# Patient Record
Sex: Female | Born: 1980 | Race: White | Hispanic: No | Marital: Married | Smoking: Never smoker
Health system: Southern US, Community
[De-identification: ages and names within clinical notes are randomized; demographics above are authoritative.]

## PROBLEM LIST (undated history)

## (undated) DIAGNOSIS — F419 Anxiety disorder, unspecified: Secondary | ICD-10-CM

## (undated) DIAGNOSIS — R102 Pelvic and perineal pain: Secondary | ICD-10-CM

## (undated) DIAGNOSIS — K37 Unspecified appendicitis: Secondary | ICD-10-CM

## (undated) DIAGNOSIS — D649 Anemia, unspecified: Secondary | ICD-10-CM

## (undated) DIAGNOSIS — Z803 Family history of malignant neoplasm of breast: Secondary | ICD-10-CM

## (undated) HISTORY — PX: TUBAL LIGATION: SHX77

## (undated) HISTORY — DX: Anemia, unspecified: D64.9

## (undated) HISTORY — DX: Family history of malignant neoplasm of breast: Z80.3

## (undated) HISTORY — DX: Anxiety disorder, unspecified: F41.9

## (undated) HISTORY — DX: Unspecified appendicitis: K37

## (undated) HISTORY — DX: Pelvic and perineal pain: R10.2

## (undated) HISTORY — PX: DILATION AND EVACUATION: SHX1459

## (undated) HISTORY — PX: LAPAROSCOPY: SHX197

## (undated) HISTORY — PX: APPENDECTOMY: SHX54

---

## 2001-08-03 ENCOUNTER — Encounter: Payer: Self-pay | Admitting: Emergency Medicine

## 2001-08-03 ENCOUNTER — Emergency Department (HOSPITAL_COMMUNITY): Admission: EM | Admit: 2001-08-03 | Discharge: 2001-08-03 | Payer: Self-pay | Admitting: Emergency Medicine

## 2004-11-25 HISTORY — PX: LAPAROSCOPY: SHX197

## 2009-12-25 ENCOUNTER — Ambulatory Visit: Payer: Self-pay | Admitting: Obstetrics & Gynecology

## 2009-12-26 ENCOUNTER — Inpatient Hospital Stay: Payer: Self-pay | Admitting: Obstetrics & Gynecology

## 2010-08-07 DIAGNOSIS — D239 Other benign neoplasm of skin, unspecified: Secondary | ICD-10-CM

## 2010-08-07 HISTORY — DX: Other benign neoplasm of skin, unspecified: D23.9

## 2012-04-20 ENCOUNTER — Ambulatory Visit: Payer: Self-pay | Admitting: Medical

## 2017-06-17 ENCOUNTER — Ambulatory Visit (INDEPENDENT_AMBULATORY_CARE_PROVIDER_SITE_OTHER): Payer: BC Managed Care – PPO | Admitting: Obstetrics and Gynecology

## 2017-06-17 ENCOUNTER — Encounter: Payer: Self-pay | Admitting: Obstetrics and Gynecology

## 2017-06-17 VITALS — BP 124/78 | Ht 66.0 in | Wt 228.0 lb

## 2017-06-17 DIAGNOSIS — Z124 Encounter for screening for malignant neoplasm of cervix: Secondary | ICD-10-CM | POA: Diagnosis not present

## 2017-06-17 DIAGNOSIS — Z1151 Encounter for screening for human papillomavirus (HPV): Secondary | ICD-10-CM | POA: Diagnosis not present

## 2017-06-17 DIAGNOSIS — Z803 Family history of malignant neoplasm of breast: Secondary | ICD-10-CM

## 2017-06-17 DIAGNOSIS — Z01419 Encounter for gynecological examination (general) (routine) without abnormal findings: Secondary | ICD-10-CM | POA: Diagnosis not present

## 2017-06-17 NOTE — Progress Notes (Signed)
Chief Complaint  Patient presents with  . Annual Exam     HPI:      [name] is a 36 y.o. [phone] who LMP was Patient's last menstrual period was 05/27/2017., presents today for her annual examination.  Her menses are Q6-8 wks (for several yrs, neg labs with Dr. Leonides Schanz last yr), lasting 5 days.  Dysmenorrhea none. She does not have intermenstrual bleeding.  Sex activity: single partner, contraception - tubal ligation.  Last Pap: October 19, 2014  Results were: no abnormalities  Hx of STDs: none  There is a FH of breast cancer in her pat aunt and mat aunt, genetic testing not done. There is no FH of ovarian cancer. The patient does do self-breast exams.  Tobacco use: The patient denies current or previous tobacco use. Alcohol use: none Exercise: moderately active  She does not get adequate calcium and Vitamin D in her diet.    Past Medical History:  Diagnosis Date  . Anemia   . Appendicitis   . Pelvic pain     Past Surgical History:  Procedure Laterality Date  . APPENDECTOMY    . CESAREAN SECTION    . LAPAROSCOPY    . TUBAL LIGATION      Family History  Problem Relation Age of Onset  . Hypertension Mother   . Hyperlipidemia Mother   . Hypertension Father   . Hyperlipidemia Father   . Breast cancer Maternal Aunt 60  . Brain cancer Maternal Aunt   . Breast cancer Paternal Aunt        age 31/50  . Colon cancer Paternal Aunt 48  . Diabetes Mellitus II Paternal Grandfather     Social History   Social History  . Marital status: Single    Spouse name: N/A  . Number of children: N/A  . Years of education: N/A   Occupational History  . Not on file.   Social History Main Topics  . Smoking status: Never Smoker  . Smokeless tobacco: Never Used  . Alcohol use No  . Drug use: No  . Sexual activity: Yes    Birth control/ protection: Surgical   Other Topics Concern  . Not on file   Social History Narrative  . No narrative on file    No  current outpatient prescriptions on file.  ROS:  Review of Systems  Constitutional: Negative for fatigue, fever and unexpected weight change.  Respiratory: Positive for cough. Negative for shortness of breath and wheezing.   Cardiovascular: Negative for chest pain, palpitations and leg swelling.  Gastrointestinal: Negative for blood in stool, constipation, diarrhea, nausea and vomiting.  Endocrine: Negative for cold intolerance, heat intolerance and polyuria.  Genitourinary: Negative for dyspareunia, dysuria, flank pain, frequency, genital sores, hematuria, menstrual problem, pelvic pain, urgency, vaginal bleeding, vaginal discharge and vaginal pain.  Musculoskeletal: Negative for back pain, joint swelling and myalgias.  Skin: Negative for rash.  Neurological: Negative for dizziness, syncope, light-headedness, numbness and headaches.  Hematological: Negative for adenopathy.  Psychiatric/Behavioral: Negative for agitation, confusion, sleep disturbance and suicidal ideas. The patient is not nervous/anxious.      Objective: BP 124/78   Ht 5\' 6"  (1.676 m)   Wt 228 lb (103.4 kg)   LMP 05/27/2017   BMI 36.80 kg/m    Physical Exam  Constitutional: She is oriented to person, place, and time. She appears well-developed and well-nourished.  Genitourinary: Vagina normal and uterus normal. There is no rash or tenderness on the right labia.  There is no rash or tenderness on the left labia. No erythema or tenderness in the vagina. No vaginal discharge found. Right adnexum does not display mass and does not display tenderness. Left adnexum does not display mass and does not display tenderness. Cervix does not exhibit motion tenderness or polyp. Uterus is not enlarged or tender.  Neck: Normal range of motion. No thyromegaly present.  Cardiovascular: Normal rate, regular rhythm and normal heart sounds.   No murmur heard. Pulmonary/Chest: Effort normal and breath sounds normal. Right breast exhibits  no mass, no nipple discharge, no skin change and no tenderness. Left breast exhibits no mass, no nipple discharge, no skin change and no tenderness.  Abdominal: Soft. There is no tenderness. There is no guarding.  Musculoskeletal: Normal range of motion.  Neurological: She is alert and oriented to person, place, and time. No cranial nerve deficit.  Psychiatric: She has a normal mood and affect. Her behavior is normal.  Vitals reviewed.   Assessment/Plan: Encounter for annual routine gynecological examination  Cervical cancer screening - Plan: IGP, Aptima HPV  Screening for HPV (human papillomavirus) - Plan: IGP, Aptima HPV  Family history of breast cancer - Pt to clarify age of dx of pat aunt. If 7, pt qualifies for [facility] testing and possibly mammos starting at age 11. Pt to f/u with info.             GYN counsel adequate intake of calcium and vitamin D     F/U  Return in about 1 year (around 06/17/2018).  Tamura Lasky B. Maurio Baize, PA-C 06/17/2017 9:48 AM

## 2017-06-23 LAB — IGP, APTIMA HPV
HPV Aptima: NEGATIVE
PAP Smear Comment: 0

## 2017-07-09 ENCOUNTER — Encounter: Payer: Self-pay | Admitting: Obstetrics and Gynecology

## 2017-07-09 ENCOUNTER — Telehealth: Payer: Self-pay | Admitting: Obstetrics and Gynecology

## 2017-07-09 NOTE — Telephone Encounter (Signed)
Pt aware of unsatisfactory pap/neg HPV DNA. She will RTO in 3-4 months for repeat pap.  Pt also found out her mat aunt had breast cancer age 35 and was BRCA neg. Pt doesn't want MyRisk testing but is concerned about having to do to mammos earlier. Will do TC model when she RTO for that info. Pt agrees with plan.

## 2018-06-24 ENCOUNTER — Ambulatory Visit: Payer: BC Managed Care – PPO | Admitting: Obstetrics and Gynecology

## 2018-06-26 DIAGNOSIS — I83813 Varicose veins of bilateral lower extremities with pain: Secondary | ICD-10-CM | POA: Insufficient documentation

## 2018-06-26 DIAGNOSIS — G44229 Chronic tension-type headache, not intractable: Secondary | ICD-10-CM | POA: Insufficient documentation

## 2018-06-26 DIAGNOSIS — M545 Low back pain, unspecified: Secondary | ICD-10-CM | POA: Insufficient documentation

## 2018-06-27 DIAGNOSIS — K769 Liver disease, unspecified: Secondary | ICD-10-CM | POA: Insufficient documentation

## 2018-07-17 DIAGNOSIS — K8020 Calculus of gallbladder without cholecystitis without obstruction: Secondary | ICD-10-CM | POA: Insufficient documentation

## 2018-07-28 ENCOUNTER — Ambulatory Visit: Payer: BC Managed Care – PPO | Admitting: Obstetrics and Gynecology

## 2018-09-02 ENCOUNTER — Ambulatory Visit: Payer: BC Managed Care – PPO | Admitting: Obstetrics and Gynecology

## 2018-09-22 ENCOUNTER — Other Ambulatory Visit (HOSPITAL_COMMUNITY)
Admission: RE | Admit: 2018-09-22 | Discharge: 2018-09-22 | Disposition: A | Discharge status: Home / Self Care | Payer: BC Managed Care – PPO | Source: Ambulatory Visit | Attending: Obstetrics and Gynecology | Admitting: Obstetrics and Gynecology

## 2018-09-22 ENCOUNTER — Encounter: Payer: Self-pay | Admitting: Obstetrics and Gynecology

## 2018-09-22 ENCOUNTER — Ambulatory Visit (INDEPENDENT_AMBULATORY_CARE_PROVIDER_SITE_OTHER): Payer: BC Managed Care – PPO | Admitting: Obstetrics and Gynecology

## 2018-09-22 VITALS — BP 120/80 | HR 69 | Ht 66.0 in | Wt 232.0 lb

## 2018-09-22 DIAGNOSIS — Z124 Encounter for screening for malignant neoplasm of cervix: Secondary | ICD-10-CM

## 2018-09-22 DIAGNOSIS — Z01419 Encounter for gynecological examination (general) (routine) without abnormal findings: Secondary | ICD-10-CM | POA: Diagnosis not present

## 2018-09-22 DIAGNOSIS — Z803 Family history of malignant neoplasm of breast: Secondary | ICD-10-CM

## 2018-09-22 NOTE — Progress Notes (Signed)
Chief Complaint  Patient presents with  . Gynecologic Exam     HPI:      [name] is a 37 y.o. [phone] who LMP was Patient's last menstrual period was 09/02/2018 (exact date)., presents today for her annual examination.  Her menses are Q6-8 wks (for several yrs, neg labs with Dr. Leonides Schanz in past), lasting 5 days.  Dysmenorrhea none. She does not have intermenstrual bleeding.  Sex activity: single partner, contraception - tubal ligation.  Last Pap: 06/17/17  Results were: UNSATISFACTORY/neg HPV DNA; Due for repeat 4 months later but never done. Hx of STDs: none  There is a FH of breast cancer in her pat aunt and mat aunt, genetic testing not done by pt. Pat aunt is "gene neg". There is no FH of ovarian cancer. The patient does do self-breast exams.  Tobacco use: The patient denies current or previous tobacco use. Alcohol use: none Exercise: moderately active  She does get adequate calcium but not Vitamin D in her diet.  Labs with PCP  Past Medical History:  Diagnosis Date  . Anemia   . Appendicitis   . Family history of breast cancer    pt declines MyRisk testing  . Pelvic pain     Past Surgical History:  Procedure Laterality Date  . APPENDECTOMY    . CESAREAN SECTION    . LAPAROSCOPY    . TUBAL LIGATION      Family History  Problem Relation Age of Onset  . Hypertension Mother   . Hyperlipidemia Mother   . Hypertension Father   . Hyperlipidemia Father   . Breast cancer Maternal Aunt 60  . Brain cancer Maternal Aunt   . Breast cancer Paternal Aunt 10       BRCA neg  . Colon cancer Paternal Aunt 40  . Diabetes Mellitus II Paternal Grandfather     Social History   Socioeconomic History  . Marital status: Single    Spouse name: Not on file  . Number of children: Not on file  . Years of education: Not on file  . Highest education level: Not on file  Occupational History  . Not on file  Social Needs  . Financial resource strain: Not on file  .  Food insecurity:    Worry: Not on file    Inability: Not on file  . Transportation needs:    Medical: Not on file    Non-medical: Not on file  Tobacco Use  . Smoking status: Never Smoker  . Smokeless tobacco: Never Used  Substance and Sexual Activity  . Alcohol use: No  . Drug use: No  . Sexual activity: Yes    Birth control/protection: Surgical  Lifestyle  . Physical activity:    Days per week: Not on file    Minutes per session: Not on file  . Stress: Not on file  Relationships  . Social connections:    Talks on phone: Not on file    Gets together: Not on file    Attends religious service: Not on file    Active member of club or organization: Not on file    Attends meetings of clubs or organizations: Not on file    Relationship status: Not on file  . Intimate partner violence:    Fear of current or ex partner: Not on file    Emotionally abused: Not on file    Physically abused: Not on file    Forced sexual activity: Not on file  Other Topics Concern  . Not on file  Social History Narrative  . Not on file    No current outpatient medications on file.  ROS:  Review of Systems  Constitutional: Negative for fatigue, fever and unexpected weight change.  Respiratory: Negative for cough, shortness of breath and wheezing.   Cardiovascular: Negative for chest pain, palpitations and leg swelling.  Gastrointestinal: Negative for blood in stool, constipation, diarrhea, nausea and vomiting.  Endocrine: Negative for cold intolerance, heat intolerance and polyuria.  Genitourinary: Negative for dyspareunia, dysuria, flank pain, frequency, genital sores, hematuria, menstrual problem, pelvic pain, urgency, vaginal bleeding, vaginal discharge and vaginal pain.  Musculoskeletal: Negative for back pain, joint swelling and myalgias.  Skin: Negative for rash.  Neurological: Negative for dizziness, syncope, light-headedness, numbness and headaches.  Hematological: Negative for  adenopathy.  Psychiatric/Behavioral: Negative for agitation, confusion, sleep disturbance and suicidal ideas. The patient is not nervous/anxious.     Objective: BP 120/80   Pulse 69   Ht 5' 6"  (1.676 m)   Wt 232 lb (105.2 kg)   LMP 09/02/2018 (Exact Date)   BMI 37.45 kg/m    Physical Exam  Constitutional: She is oriented to person, place, and time. She appears well-developed and well-nourished.  Genitourinary: Vagina normal and uterus normal. There is no rash or tenderness on the right labia. There is no rash or tenderness on the left labia. No erythema or tenderness in the vagina. No vaginal discharge found. Right adnexum does not display mass and does not display tenderness. Left adnexum does not display mass and does not display tenderness. Cervix does not exhibit motion tenderness or polyp. Uterus is not enlarged or tender.  Neck: Normal range of motion. No thyromegaly present.  Cardiovascular: Normal rate, regular rhythm and normal heart sounds.  No murmur heard. Pulmonary/Chest: Effort normal and breath sounds normal. Right breast exhibits no mass, no nipple discharge, no skin change and no tenderness. Left breast exhibits no mass, no nipple discharge, no skin change and no tenderness.  Abdominal: Soft. There is no tenderness. There is no guarding.  Musculoskeletal: Normal range of motion.  Neurological: She is alert and oriented to person, place, and time. No cranial nerve deficit.  Psychiatric: She has a normal mood and affect. Her behavior is normal.  Vitals reviewed.   Assessment/Plan: Encounter for annual routine gynecological examination  Cervical cancer screening - Repeat today due to unsatisfact last yr. - Plan: Cytology - PAP  Family history of breast cancer - MyRisk testing discussed and handout given. Will consider and f/u for lab if desires.              GYN counsel adequate intake of calcium and vitamin D     F/U  Return in about 1 year (around  09/23/2019).  Alicia B. Copland, PA-C 09/22/2018 8:51 AM

## 2018-09-22 NOTE — Patient Instructions (Signed)
I value your feedback and entrusting us with your care. If you get a [facility] patient survey, I would appreciate you taking the time to let us know about your experience today. Thank you! 

## 2018-09-23 LAB — CYTOLOGY - PAP: Diagnosis: NEGATIVE

## 2018-11-06 ENCOUNTER — Ambulatory Visit
Admission: EM | Admit: 2018-11-06 | Discharge: 2018-11-06 | Disposition: A | Discharge status: Home / Self Care | Payer: BC Managed Care – PPO | Attending: Family Medicine | Admitting: Family Medicine

## 2018-11-06 ENCOUNTER — Encounter: Payer: Self-pay | Admitting: Emergency Medicine

## 2018-11-06 ENCOUNTER — Other Ambulatory Visit: Payer: Self-pay

## 2018-11-06 DIAGNOSIS — J988 Other specified respiratory disorders: Secondary | ICD-10-CM | POA: Diagnosis not present

## 2018-11-06 MED ORDER — AMOXICILLIN-POT CLAVULANATE 875-125 MG PO TABS: 1.0000 | ORAL_TABLET | Freq: Two times a day (BID) | ORAL | 0 refills | Status: DC

## 2018-11-06 NOTE — ED Provider Notes (Signed)
[facility]    CSN: [id number] Arrival date & time: 11/06/18  0254  History   Chief Complaint Chief Complaint  Patient presents with  . Cough   HPI  37 year old female presents with respiratory symptoms.  Patient reports that she has had cough and congestion for the past week.  She is been taking multiple over-the-counter medications without resolution.  Today she developed bilateral ear pain.  She states that is quite severe.  She has had fever but none of the past several days.  Last fever was 101 on Tuesday.  No known exacerbating factors.  No other reported symptoms.  No other complaints.  PMH, Surgical Hx, Family Hx, Social History reviewed and updated as below.  Past Medical History:  Diagnosis Date  . Anemia   . Appendicitis   . Family history of breast cancer    pt declines MyRisk testing  . Pelvic pain    Past Surgical History:  Procedure Laterality Date  . APPENDECTOMY    . CESAREAN SECTION    . LAPAROSCOPY    . TUBAL LIGATION      OB History    Gravida  4   Para  3   Term  3   Preterm      AB  1   Living  3     SAB  1   TAB      Ectopic      Multiple      Live Births  3            Home Medications    Prior to Admission medications   Medication Sig Start Date End Date Taking? Authorizing Provider  amoxicillin-clavulanate (AUGMENTIN) 875-125 MG tablet Take 1 tablet by mouth every 12 (twelve) hours. 11/06/18   Coral Spikes, DO    Family History Family History  Problem Relation Age of Onset  . Hypertension Mother   . Hyperlipidemia Mother   . Hypertension Father   . Hyperlipidemia Father   . Breast cancer Maternal Aunt 60  . Brain cancer Maternal Aunt   . Breast cancer Paternal Aunt 88       BRCA neg  . Colon cancer Paternal Aunt 20  . Diabetes Mellitus II Paternal Grandfather     Social History Social History   Tobacco Use  . Smoking status: Never Smoker  . Smokeless tobacco: Never Used  Substance Use  Topics  . Alcohol use: No  . Drug use: No     Allergies   Patient has no known allergies.   Review of Systems Review of Systems  Constitutional: Positive for fever.  HENT: Positive for congestion and ear pain.   Respiratory: Positive for cough.    Physical Exam Triage Vital Signs ED Triage Vitals  Enc Vitals Group     BP 11/06/18 1935 (!) 150/89     Pulse Rate 11/06/18 1935 93     Resp 11/06/18 1935 16     Temp 11/06/18 1935 97.6 F (36.4 C)     Temp Source 11/06/18 1935 Oral     SpO2 11/06/18 1935 100 %     Weight 11/06/18 1933 225 lb (102.1 kg)     Height 11/06/18 1933 5' 6"  (1.676 m)     Head Circumference --      Peak Flow --      Pain Score 11/06/18 1933 7     Pain Loc --      Pain Edu? --  Excl. in [place]? --    Updated Vital Signs BP (!) 150/89 (BP Location: Right Arm)   Pulse 93   Temp 97.6 F (36.4 C) (Oral)   Resp 16   Ht 5' 6"  (1.676 m)   Wt 102.1 kg   LMP 10/16/2018 (Approximate)   SpO2 100%   BMI 36.32 kg/m   Visual Acuity Right Eye Distance:   Left Eye Distance:   Bilateral Distance:    Right Eye Near:   Left Eye Near:    Bilateral Near:     Physical Exam Vitals signs and nursing note reviewed.  Constitutional:      General: She is not in acute distress.    Appearance: Normal appearance.  HENT:     Head: Normocephalic and atraumatic.     Comments: TMs obscured by cerumen bilaterally.  Irrigation was successful on the left but not on the right.  Left TM appears to be slightly bulging and slightly erythematous.  Cannot visualize right TM.    Nose: No rhinorrhea.  Eyes:     General:        Right eye: No discharge.        Left eye: No discharge.     Conjunctiva/sclera: Conjunctivae normal.  Cardiovascular:     Rate and Rhythm: Normal rate and regular rhythm.  Pulmonary:     Effort: Pulmonary effort is normal.     Breath sounds: No wheezing, rhonchi or rales.  Neurological:     Mental Status: She is alert.  Psychiatric:         Mood and Affect: Mood normal.        Behavior: Behavior normal.    UC Treatments / Results  Labs (all labs ordered are listed, but only abnormal results are displayed) Labs Reviewed - No data to display  EKG None  Radiology No results found.  Procedures Procedures (including critical care time)  Medications Ordered in UC Medications - No data to display  Initial Impression / Assessment and Plan / UC Course  I have reviewed the triage vital signs and the nursing notes.  Pertinent labs & imaging results that were available during my care of the patient were reviewed by me and considered in my medical decision making (see chart for details).    37 year old female presents with a respiratory infection. Given persistent symptoms without improvement, placing on Augmentin.   Final Clinical Impressions(s) / UC Diagnoses   Final diagnoses:  Respiratory infection     Discharge Instructions     Antibiotic as prescribed.  Take care  Dr. Lacinda Axon    ED Prescriptions    Medication Sig Dispense Auth. Provider   amoxicillin-clavulanate (AUGMENTIN) 875-125 MG tablet Take 1 tablet by mouth every 12 (twelve) hours. 14 tablet Coral Spikes, DO     Controlled Substance Prescriptions [place] Controlled Substance Registry consulted? Not Applicable   Coral Spikes, DO 11/06/18 2034

## 2018-11-06 NOTE — Discharge Instructions (Signed)
Antibiotic as prescribed.  Take care  Dr. Rotha Cassels  

## 2018-11-06 NOTE — ED Triage Notes (Signed)
Patient c/o cough and chest congestion since Sunday.  Patient c/o bilateral ear pain that started today.

## 2019-10-27 DIAGNOSIS — R222 Localized swelling, mass and lump, trunk: Secondary | ICD-10-CM | POA: Insufficient documentation

## 2020-06-08 ENCOUNTER — Ambulatory Visit: Payer: BC Managed Care – PPO | Admitting: Dermatology

## 2020-06-08 ENCOUNTER — Other Ambulatory Visit: Payer: Self-pay

## 2020-06-08 DIAGNOSIS — B078 Other viral warts: Secondary | ICD-10-CM | POA: Diagnosis not present

## 2020-06-08 NOTE — Patient Instructions (Addendum)
Recommend daily broad spectrum sunscreen SPF 30+ to sun-exposed areas, reapply every 2 hours as needed. Call for new or changing lesions.  This is a WART caused by the human papilloma virus. It is not dangerous but is contagious and can spread to other areas of skin or other people if it is not completely gone. No additional treatment is needed. However, if it comes back, we can freeze it in clinic with liquid nitrogen (a quick in office procedure) or you can also treat it at home with an over the counter salicylic wart treatment (slower).  Please call the office at [phone] or message [place] if you have have any questions.  Cryotherapy Aftercare  . Wash gently with soap and water everyday.   Marland Kitchen Apply Vaseline and Band-Aid daily until healed.

## 2020-06-08 NOTE — Progress Notes (Signed)
   Follow-Up Visit   Subjective  Katie Cross is a 39 y.o. female who presents for the following: Warts.  Patient here today to have warts on her feet looked at. She is not sure how long they've been there but they started bothering her a few months ago. No treatment.  The following portions of the chart were reviewed this encounter and updated as appropriate:  Tobacco  Allergies  Meds  Problems  Med Hx  Surg Hx  Fam Hx     Review of Systems:  No other skin or systemic complaints except as noted in HPI or Assessment and Plan.  Objective  Well appearing patient in no apparent distress; mood and affect are within normal limits.  A focused examination was performed including feet. Relevant physical exam findings are noted in the Assessment and Plan.  Objective  Right Heel (2): R lateral heel 2.0cm R medial heel 0.5cm Verrucous papules -- Discussed viral etiology and contagion.    Assessment & Plan    Other viral warts (2) Right Heel  Areas treated with LN2 followed by Candida injection to smaller wart. Candida Lot # [id number]  Exp: 07/11/2020 Candida total 0.1cc  Squaric Acid 3% applied to right upper arm for sensitization.  Squaric Acid 3% applied to warts followed by Cantharone PS and covered with band-aids.     Destruction of lesion - Right Heel Complexity: simple   Destruction method: cryotherapy   Informed consent: discussed and consent obtained   Timeout:  patient name, date of birth, surgical site, and procedure verified Lesion destroyed using liquid nitrogen: Yes   Region frozen until ice ball extended beyond lesion: Yes   Outcome: patient tolerated procedure well with no complications   Post-procedure details: wound care instructions given    Return in about 6 weeks (around 07/20/2020) for warts.  Graciella Belton, RMA, am acting as scribe for Sarina Ser, MD . Documentation: I have reviewed the above documentation for accuracy and completeness,  and I agree with the above.  Sarina Ser, MD

## 2020-06-12 ENCOUNTER — Encounter: Payer: Self-pay | Admitting: Dermatology

## 2020-07-27 ENCOUNTER — Ambulatory Visit: Payer: BC Managed Care – PPO | Admitting: Dermatology

## 2020-08-01 ENCOUNTER — Ambulatory Visit: Payer: BC Managed Care – PPO | Admitting: Obstetrics and Gynecology

## 2020-08-27 NOTE — Progress Notes (Deleted)
No chief complaint on file.    HPI:      [name] is a 39 y.o. [phone] who LMP was No LMP recorded., presents today for her annual examination.  Her menses are Q6-8 wks (for several yrs, neg labs with Dr. Leonides Cross in past), lasting 5 days.  Dysmenorrhea none. She does not have intermenstrual bleeding.  Sex activity: single partner, contraception - tubal ligation.  Last Pap: 09/22/18  Results were: neg cells/neg HPV DNA 2018 Hx of STDs: none  There is a FH of breast cancer in her pat aunt and mat aunt, genetic testing not done by pt. Pat aunt is "gene neg". There is no FH of ovarian cancer. The patient does do self-breast exams.  Tobacco use: The patient denies current or previous tobacco use. Alcohol use: none  No drug use Exercise: moderately active  She does get adequate calcium but not Vitamin D in her diet.  Labs with PCP  Past Medical History:  Diagnosis Date  . Anemia   . Appendicitis   . Dysplastic nevus 08/07/2010   left med lower leg mild to moderate, left buttock moderate atypia  . Dysplastic nevus 12/12/2015   right upper abdomen, halo nevus with mild atypia, limited margins free.  Marland Kitchen Dysplastic nevus 06/23/2018   left spinal lower back, moderate atypia, margins free.  . Family history of breast cancer    pt declines MyRisk testing  . Pelvic pain     Past Surgical History:  Procedure Laterality Date  . APPENDECTOMY    . CESAREAN SECTION    . LAPAROSCOPY    . TUBAL LIGATION      Family History  Problem Relation Age of Onset  . Hypertension Mother   . Hyperlipidemia Mother   . Hypertension Father   . Hyperlipidemia Father   . Breast cancer Maternal Aunt 60  . Brain cancer Maternal Aunt   . Breast cancer Paternal Aunt 41       BRCA neg  . Colon cancer Paternal Aunt 12  . Diabetes Mellitus II Paternal Grandfather     Social History   Socioeconomic History  . Marital status: Single    Spouse name: Not on file  . Number of children: Not  on file  . Years of education: Not on file  . Highest education level: Not on file  Occupational History  . Not on file  Tobacco Use  . Smoking status: Never Smoker  . Smokeless tobacco: Never Used  Vaping Use  . Vaping Use: Never used  Substance and Sexual Activity  . Alcohol use: No  . Drug use: No  . Sexual activity: Yes    Birth control/protection: Surgical  Other Topics Concern  . Not on file  Social History Narrative  . Not on file   Social Determinants of Health   Financial Resource Strain:   . Difficulty of Paying Living Expenses: Not on file  Food Insecurity:   . Worried About Charity fundraiser in the Last Year: Not on file  . Ran Out of Food in the Last Year: Not on file  Transportation Needs:   . Lack of Transportation (Medical): Not on file  . Lack of Transportation (Non-Medical): Not on file  Physical Activity:   . Days of Exercise per Week: Not on file  . Minutes of Exercise per Session: Not on file  Stress:   . Feeling of Stress : Not on file  Social Connections:   . Frequency  of Communication with Friends and Family: Not on file  . Frequency of Social Gatherings with Friends and Family: Not on file  . Attends Religious Services: Not on file  . Active Member of Clubs or Organizations: Not on file  . Attends Archivist Meetings: Not on file  . Marital Status: Not on file  Intimate Partner Violence:   . Fear of Current or Ex-Partner: Not on file  . Emotionally Abused: Not on file  . Physically Abused: Not on file  . Sexually Abused: Not on file     Current Outpatient Medications:  .  amoxicillin-clavulanate (AUGMENTIN) 875-125 MG tablet, Take 1 tablet by mouth every 12 (twelve) hours. (Patient not taking: Reported on 06/08/2020), Disp: 14 tablet, Rfl: 0  ROS:  Review of Systems  Constitutional: Negative for fatigue, fever and unexpected weight change.  Respiratory: Negative for cough, shortness of breath and wheezing.   Cardiovascular:  Negative for chest pain, palpitations and leg swelling.  Gastrointestinal: Negative for blood in stool, constipation, diarrhea, nausea and vomiting.  Endocrine: Negative for cold intolerance, heat intolerance and polyuria.  Genitourinary: Negative for dyspareunia, dysuria, flank pain, frequency, genital sores, hematuria, menstrual problem, pelvic pain, urgency, vaginal bleeding, vaginal discharge and vaginal pain.  Musculoskeletal: Negative for back pain, joint swelling and myalgias.  Skin: Negative for rash.  Neurological: Negative for dizziness, syncope, light-headedness, numbness and headaches.  Hematological: Negative for adenopathy.  Psychiatric/Behavioral: Negative for agitation, confusion, sleep disturbance and suicidal ideas. The patient is not nervous/anxious.     Objective: There were no vitals taken for this visit.   Physical Exam Constitutional:      Appearance: She is well-developed.  Genitourinary:     Vagina and uterus normal.     No vaginal discharge, erythema or tenderness.     No cervical motion tenderness or polyp.     Uterus is not enlarged or tender.     No right or left adnexal mass present.     Right adnexa not tender.     Left adnexa not tender.  Neck:     Thyroid: No thyromegaly.  Cardiovascular:     Rate and Rhythm: Normal rate and regular rhythm.     Heart sounds: Normal heart sounds. No murmur heard.   Pulmonary:     Effort: Pulmonary effort is normal.     Breath sounds: Normal breath sounds.  Chest:     Breasts:        Right: No mass, nipple discharge, skin change or tenderness.        Left: No mass, nipple discharge, skin change or tenderness.  Abdominal:     Palpations: Abdomen is soft.     Tenderness: There is no abdominal tenderness. There is no guarding.  Musculoskeletal:        General: Normal range of motion.     Cervical back: Normal range of motion.  Neurological:     Mental Status: She is alert and oriented to person, place, and  time.     Cranial Nerves: No cranial nerve deficit.  Psychiatric:        Behavior: Behavior normal.  Vitals reviewed.     Assessment/Plan: No diagnosis found.             GYN counsel adequate intake of calcium and vitamin D     F/U  No follow-ups on file.  [name] B. Adriana Quinby, PA-C 08/27/2020 8:00 PM

## 2020-08-28 ENCOUNTER — Ambulatory Visit: Payer: Self-pay | Admitting: Obstetrics and Gynecology

## 2020-09-11 ENCOUNTER — Ambulatory Visit (INDEPENDENT_AMBULATORY_CARE_PROVIDER_SITE_OTHER): Payer: BC Managed Care – PPO | Admitting: Obstetrics

## 2020-09-11 ENCOUNTER — Other Ambulatory Visit (HOSPITAL_COMMUNITY)
Admission: RE | Admit: 2020-09-11 | Discharge: 2020-09-11 | Disposition: A | Discharge status: Home / Self Care | Payer: BC Managed Care – PPO | Source: Ambulatory Visit | Attending: Obstetrics | Admitting: Obstetrics

## 2020-09-11 ENCOUNTER — Other Ambulatory Visit: Payer: Self-pay

## 2020-09-11 ENCOUNTER — Encounter: Payer: Self-pay | Admitting: Obstetrics

## 2020-09-11 VITALS — BP 140/90 | HR 78 | Ht 66.0 in | Wt 240.0 lb

## 2020-09-11 DIAGNOSIS — Z01419 Encounter for gynecological examination (general) (routine) without abnormal findings: Secondary | ICD-10-CM | POA: Diagnosis not present

## 2020-09-11 DIAGNOSIS — Z124 Encounter for screening for malignant neoplasm of cervix: Secondary | ICD-10-CM | POA: Diagnosis not present

## 2020-09-12 NOTE — Progress Notes (Signed)
Gynecology Annual Exam  PCP: Sofie Hartigan, MD  Chief Complaint:  Chief Complaint  Patient presents with  . Gynecologic Exam    heavy bleeding; ? hysterectomy.    History of Present Illness: Patient is a 39 y.o. [id number] presents for annual exam. The patient has some concerns about her slightly irregular menses, and complains that at times, they are much heavier, with clots. She has to change a plus size tampon every few hours at the beginning of her periods.She has had a BTL, finds the bleeding tiresome. She mentions that perhaps a hysterectomy would take care of this problem...   LMP: Patient's last menstrual period was 08/25/2020 (exact date). Average Interval: irregular, 29 days Duration of flow: 7 days Heavy Menses: yes Clots: yes, occasionally Intermenstrual Bleeding: no Postcoital Bleeding: no Dysmenorrhea: no   The patient is sexually active. She currently uses tubal ligation for contraception. She denies dyspareunia.  The patient does perform self breast exams.  There is notable family history of breast or ovarian cancer in her family.  The patient wears seatbelts: yes.   The patient has regular exercise: no.    The patient denies current symptoms of depression.    Review of Systems: ROS  Past Medical History:  Patient Active Problem List   Diagnosis Date Noted  . Well woman exam with routine gynecological exam 09/11/2020    Past Surgical History:  Past Surgical History:  Procedure Laterality Date  . APPENDECTOMY    . CESAREAN SECTION    . LAPAROSCOPY    . TUBAL LIGATION      Gynecologic History:  Patient's last menstrual period was 08/25/2020 (exact date). Contraception: tubal ligation Last Pap: Results were: 2019 no abnormalities  Last mammogram: 2020 Results were: normal  Obstetric History: [id number]  Family History:  Family History  Problem Relation Age of Onset  . Hypertension Mother   . Hyperlipidemia Mother   . Hypertension Father   .  Hyperlipidemia Father   . Breast cancer Maternal Aunt 60  . Brain cancer Maternal Aunt   . Breast cancer Paternal Aunt 44       BRCA neg  . Colon cancer Paternal Aunt 28  . Diabetes Mellitus II Paternal Grandfather     Social History:  Social History   Socioeconomic History  . Marital status: Single    Spouse name: Not on file  . Number of children: Not on file  . Years of education: Not on file  . Highest education level: Not on file  Occupational History  . Not on file  Tobacco Use  . Smoking status: Never Smoker  . Smokeless tobacco: Never Used  Vaping Use  . Vaping Use: Never used  Substance and Sexual Activity  . Alcohol use: No  . Drug use: No  . Sexual activity: Yes    Birth control/protection: Surgical    Comment: tubal  Other Topics Concern  . Not on file  Social History Narrative  . Not on file   Social Determinants of Health   Financial Resource Strain:   . Difficulty of Paying Living Expenses: Not on file  Food Insecurity:   . Worried About Charity fundraiser in the Last Year: Not on file  . Ran Out of Food in the Last Year: Not on file  Transportation Needs:   . Lack of Transportation (Medical): Not on file  . Lack of Transportation (Non-Medical): Not on file  Physical Activity:   . Days of Exercise per  Week: Not on file  . Minutes of Exercise per Session: Not on file  Stress:   . Feeling of Stress : Not on file  Social Connections:   . Frequency of Communication with Friends and Family: Not on file  . Frequency of Social Gatherings with Friends and Family: Not on file  . Attends Religious Services: Not on file  . Active Member of Clubs or Organizations: Not on file  . Attends Archivist Meetings: Not on file  . Marital Status: Not on file  Intimate Partner Violence:   . Fear of Current or Ex-Partner: Not on file  . Emotionally Abused: Not on file  . Physically Abused: Not on file  . Sexually Abused: Not on file    Allergies:    No Known Allergies  Medications: Prior to Admission medications   Medication Sig Start Date End Date Taking? Authorizing Provider  aspirin 81 MG EC tablet Take 1 tablet by mouth daily.    [provider]    Physical Exam Vitals: Blood pressure 140/90, pulse 78, height 5' 6"  (1.676 m), weight 240 lb (108.9 kg), last menstrual period 08/25/2020.  General: NAD HEENT: normocephalic, anicteric Thyroid: no enlargement, no palpable nodules Pulmonary: No increased work of breathing, CTAB Cardiovascular: RRR, distal pulses 2+ Breast: Breast symmetrical, no tenderness, no palpable nodules or masses, no skin or nipple retraction present, no nipple discharge.  No axillary or supraclavicular lymphadenopathy. Abdomen: NABS, soft, non-tender, non-distended.  Umbilicus without lesions.  No hepatomegaly, splenomegaly or masses palpable. No evidence of hernia  Genitourinary:  External: Normal external female genitalia.  Normal urethral meatus, normal Bartholin's and Skene's glands.    Vagina: Normal vaginal mucosa, no evidence of prolapse.    Cervix: Grossly normal in appearance, no bleeding  Uterus: Non-enlarged, mobile, normal contour.  No CMT  Adnexa: ovaries non-enlarged, no adnexal masses  Rectal: deferred  Lymphatic: no evidence of inguinal lymphadenopathy Extremities: no edema, erythema, or tenderness Neurologic: Grossly intact Psychiatric: mood appropriate, affect full  Female chaperone present for pelvic and breast  portions of the physical exam    Assessment: 39 y.o. [id number] routine annual exam  Plan: Problem List Items Addressed This Visit      Other   Well woman exam with routine gynecological exam    Other Visit Diagnoses    Women's annual routine gynecological examination    -  Primary   Relevant Orders   Cytology - PAP   Comprehensive metabolic panel   TSH + free T4   Vitamin D (25 hydroxy)   Cervical cancer screening       Relevant Orders   Cytology - PAP       1) Mammogram - recommend yearly screening mammogram.  Mammogram Is up to date   2) STI screening  wasoffered and declined  3) ASCCP guidelines and rational discussed.  Patient opts for every 3 years screening interval  4) Contraception - the patient is currently using  tubal ligation.  She is happy with her current form of contraception and plans to continue  5) Colonoscopy -- Screening recommended starting at age 38 for average risk individuals, age 41 for individuals deemed at increased risk (including African Americans) and recommended to continue until age 27.  For patient age 44-85 individualized approach is recommended.  Gold standard screening is via colonoscopy, Cologuard screening is an acceptable alternative for patient unwilling or unable to undergo colonoscopy.  "Colorectal cancer screening for average?risk adults: 2018 guideline update from the American  Cancer Society"[place]: A Cancer Journal for Clinicians: Apr 23, 2017   6) Routine healthcare maintenance including cholesterol, diabetes screening discussed managed by PCP  7) Return in about 1 month (around 10/12/2020) for  consutation wtih Glennon Mac re heavy periods and options for treatment.  We discussed options for treatment, including ablation, an IUD, and further discussion with Glennon Mac, with whom she will make her appointment.  Imagene Riches, CNM  09/12/2020 10:10 AM     [facility] 09/12/2020, 10:01 AM

## 2020-09-14 LAB — CYTOLOGY - PAP
Comment: NEGATIVE
Diagnosis: UNDETERMINED — AB
High risk HPV: NEGATIVE

## 2020-09-15 ENCOUNTER — Encounter: Payer: Self-pay | Admitting: Obstetrics

## 2020-09-15 NOTE — Progress Notes (Signed)
ASCUS pap result. Note sent in My chart to Patient. Her HPV screen is negative Plan to repeat pap in one year.

## 2020-09-26 ENCOUNTER — Ambulatory Visit: Payer: BC Managed Care – PPO | Admitting: Dermatology

## 2020-09-26 ENCOUNTER — Other Ambulatory Visit: Payer: Self-pay

## 2020-09-26 DIAGNOSIS — D2262 Melanocytic nevi of left upper limb, including shoulder: Secondary | ICD-10-CM | POA: Diagnosis not present

## 2020-09-26 DIAGNOSIS — D485 Neoplasm of uncertain behavior of skin: Secondary | ICD-10-CM | POA: Diagnosis not present

## 2020-09-26 DIAGNOSIS — D229 Melanocytic nevi, unspecified: Secondary | ICD-10-CM

## 2020-09-26 DIAGNOSIS — B079 Viral wart, unspecified: Secondary | ICD-10-CM

## 2020-09-26 MED ORDER — MUPIROCIN 2 % EX OINT: 1.0000 "application " | TOPICAL_OINTMENT | Freq: Two times a day (BID) | CUTANEOUS | 0 refills | Status: DC

## 2020-09-26 NOTE — Progress Notes (Signed)
Follow-Up Visit   Subjective  Katie Cross is a 39 y.o. female who presents for the following: wart (R heel - previously treated with Candida injections, squaric acid 3%, squaric acid sensitization, and Canthacur PS), abscess (on the buttocks - painful and drains. Has been there for a few months), changing nevus (L forearm - growing larger, more raised), and irritated nevus (Post neck - patient would like removed today ).   The following portions of the chart were reviewed this encounter and updated as appropriate:  Tobacco  Allergies  Meds  Problems  Med Hx  Surg Hx  Fam Hx     Review of Systems:  No other skin or systemic complaints except as noted in HPI or Assessment and Plan.  Objective  Well appearing patient in no apparent distress; mood and affect are within normal limits.  A focused examination was performed including the neck, arms, back, buttocks, and L foot. Relevant physical exam findings are noted in the Assessment and Plan.  Objective  R heel (17): Cluster of numerous verrucous papules >15 and 2 smaller nearby verrucous papules  Objective  Post base of neck: 0.5 cm erythematous brown macule      Objective  R sup gluteal crease: Erythematous papule 0.8 cm      Objective  L forearm: 0.4 cm thin tan papule without suspicious features on dermoscopy  Images    Assessment & Plan  Viral warts, unspecified type (17) R heel  Discussed viral etiology and risk of spread.  Discussed multiple treatments may be required to clear warts.  Discussed possible post-treatment dyspigmentation and risk of recurrence.  Cantharidin is a blistering agent that comes from a beetle.  It needs to be washed off in about 4 hours after application.  Although it is painless when applied in office, it may cause symptoms of mild pain and burning several hours later.  Treated areas will swell and turn red, and blisters may form.  Vaseline and a bandaid may be applied until  wound has healed.  Once healed, the skin may remain temporarily discolored.  It can take weeks to months for pigmentation to return to normal.    Destruction of lesion - R heel Complexity: simple   Destruction method: chemical removal   Destruction method comment:  Squaric acid 3%, and Canthacur PS Timeout:  patient name, date of birth, surgical site, and procedure verified Chemical destruction method: cantharidin   Application time:  4 hours Procedure instructions: patient instructed to wash and dry area   Outcome: patient tolerated procedure well with no complications   Post-procedure details: sterile dressing applied and wound care instructions given   Dressing type: bandage   Additional details:  Squaric acid 3% and Canthacur PS applied to aa's. Patient instructed to wash off in 4 hours.  Intralesional injection - R heel Location: R heel   Informed Consent: Discussed risks (infection, pain, bleeding, bruising, thinning of the skin, loss of skin pigment, lack of resolution, and recurrence of lesion) and benefits of the procedure, as well as the alternatives. Informed consent was obtained. Preparation: The area was prepared a standard fashion.  Procedure Details: An intralesional injection was performed with candida antigen. 0.3 cc in total were injected.  Total number of injections: 3  Plan: The patient was instructed on post-op care. Recommend OTC analgesia as needed for pain.   Neoplasm of uncertain behavior of skin (2) Post base of neck  Epidermal / dermal shaving  Lesion diameter (cm):  0.5 Informed consent: discussed and consent obtained   Timeout: patient name, date of birth, surgical site, and procedure verified   Procedure prep:  Patient was prepped and draped in usual sterile fashion Prep type:  Isopropyl alcohol Anesthesia: the lesion was anesthetized in a standard fashion   Anesthetic:  1% lidocaine w/ epinephrine 1-100,000 buffered w/ 8.4% NaHCO3 Instrument  used: flexible razor blade   Hemostasis achieved with: pressure, aluminum chloride and electrodesiccation   Outcome: patient tolerated procedure well   Post-procedure details: sterile dressing applied and wound care instructions given   Dressing type: bandage and petrolatum    mupirocin ointment (BACTROBAN) 2 %  Specimen 2 - Surgical pathology Differential Diagnosis: D48.5 irritated nevus r/o dysplasia  Check Margins: No  R sup gluteal crease  Epidermal / dermal shaving  Lesion diameter (cm):  0.8 Informed consent: discussed and consent obtained   Timeout: patient name, date of birth, surgical site, and procedure verified   Procedure prep:  Patient was prepped and draped in usual sterile fashion Prep type:  Isopropyl alcohol Anesthesia: the lesion was anesthetized in a standard fashion   Anesthetic:  1% lidocaine w/ epinephrine 1-100,000 buffered w/ 8.4% NaHCO3 Instrument used: flexible razor blade   Hemostasis achieved with: pressure, aluminum chloride and electrodesiccation   Outcome: patient tolerated procedure well   Post-procedure details: sterile dressing applied and wound care instructions given   Dressing type: bandage and petrolatum    Specimen 1 - Surgical pathology Differential Diagnosis: D48.5 r/o pyogenic granuloma vs pilonidal cyst vs irritated nevus vs other  Check Margins: No  Discussed wound care. Start Mupirocin 2% ointment to aa's QD until healed.  Nevus L forearm  Benign-appearing.  Observation.  Call clinic for new or changing lesions.  Recommend daily use of broad spectrum spf 30+ sunscreen to sun-exposed areas.    Return in about 4 weeks (around 10/24/2020) for 4-6 wks - wart recheck, biopsy recheck, nevus recheck.   Luther Redo, CMA, am acting as scribe for Forest Gleason, MD .  Documentation: I have reviewed the above documentation for accuracy and completeness, and I agree with the above.  Forest Gleason, MD

## 2020-09-26 NOTE — Patient Instructions (Signed)

## 2020-09-28 ENCOUNTER — Encounter: Payer: Self-pay | Admitting: Dermatology

## 2020-10-03 ENCOUNTER — Telehealth: Payer: Self-pay

## 2020-10-03 NOTE — Telephone Encounter (Signed)
Informed pt of all results. She had no questions or concerns.

## 2020-10-03 NOTE — Progress Notes (Signed)
1. Skin , right sup gluteal crease PYOGENIC GRANULOMA  This is a benign blood vessel growth that can sometimes come back. If it comes back, we can treat with a medicine (topical timolol solution) or I can do cautery in the clinic again.   2. Skin , post base of neck MELANOCYTIC NEVUS, INTRADERMAL TYPE  This is a NORMAL MOLE. No additional treatment is needed. If you notice any new or changing spots or have other skin concerns in future, please call our office at [phone].    MAs please call

## 2020-10-03 NOTE — Telephone Encounter (Signed)
-----   Message from Alfonso Patten, MD sent at 10/03/2020  9:54 AM EST ----- 1. Skin , right sup gluteal crease PYOGENIC GRANULOMA  This is a benign blood vessel growth that can sometimes come back. If it comes back, we can treat with a medicine (topical timolol solution) or I can do cautery in the clinic again.   2. Skin , post base of neck MELANOCYTIC NEVUS, INTRADERMAL TYPE  This is a NORMAL MOLE. No additional treatment is needed. If you notice any new or changing spots or have other skin concerns in future, please call our office at [phone].    MAs please call

## 2020-11-02 ENCOUNTER — Other Ambulatory Visit: Payer: Self-pay

## 2020-11-02 ENCOUNTER — Ambulatory Visit: Payer: BC Managed Care – PPO | Admitting: Dermatology

## 2020-11-02 ENCOUNTER — Encounter: Payer: Self-pay | Admitting: Dermatology

## 2020-11-02 DIAGNOSIS — L980 Pyogenic granuloma: Secondary | ICD-10-CM

## 2020-11-02 DIAGNOSIS — D489 Neoplasm of uncertain behavior, unspecified: Secondary | ICD-10-CM

## 2020-11-02 DIAGNOSIS — D2262 Melanocytic nevi of left upper limb, including shoulder: Secondary | ICD-10-CM | POA: Diagnosis not present

## 2020-11-02 DIAGNOSIS — D229 Melanocytic nevi, unspecified: Secondary | ICD-10-CM

## 2020-11-02 DIAGNOSIS — B079 Viral wart, unspecified: Secondary | ICD-10-CM | POA: Diagnosis not present

## 2020-11-02 NOTE — Patient Instructions (Addendum)
Cantharidin is a blistering agent that comes from a beetle.  It needs to be washed off in about 4 hours after application.  Although it is painless when applied in office, it may cause symptoms of mild pain and burning several hours later.  Treated areas will swell and turn red, and blisters may form.  Vaseline and a bandaid may be applied until wound has healed.  Once healed, the skin may remain temporarily discolored.  It can take weeks to months for pigmentation to return to normal.      Wound Care Instructions  1. Cleanse wound gently with soap and water once a day then pat dry with clean gauze. Apply a thing coat of Petrolatum (petroleum jelly, "Vaseline") over the wound (unless you have an allergy to this). We recommend that you use a new, sterile tube of Vaseline. Do not pick or remove scabs. Do not remove the yellow or white "healing tissue" from the base of the wound.  2. Cover the wound with fresh, clean, nonstick gauze and secure with paper tape. You may use Band-Aids in place of gauze and tape if the would is small enough, but would recommend trimming much of the tape off as there is often too much. Sometimes Band-Aids can irritate the skin.  3. You should call the office for your biopsy report after 1 week if you have not already been contacted.  4. If you experience any problems, such as abnormal amounts of bleeding, swelling, significant bruising, significant pain, or evidence of infection, please call the office immediately.  5. FOR ADULT SURGERY PATIENTS: If you need something for pain relief you may take 1 extra strength Tylenol (acetaminophen) AND 2 Ibuprofen (200mg  each) together every 4 hours as needed for pain. (do not take these if you are allergic to them or if you have a reason you should not take them.) Typically, you may only need pain medication for 1 to 3 days.

## 2020-11-02 NOTE — Progress Notes (Signed)
Follow-Up Visit   Subjective  Katie Cross is a 39 y.o. female who presents for the following: Follow-up (Wart recheck on right heel, recheck neoplasm at right superior gluteal crease and nevus recheck on left forearm.).  Warts previously treated with cantharone plus, squaric acid, and candida injection with significant improvement.  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 Patient states she feels it has came back and would like to have treatment to remove.   The following portions of the chart were reviewed this encounter and updated as appropriate:  Tobacco  Allergies  Meds  Problems  Med Hx  Surg Hx  Fam Hx        Objective  Well appearing patient in no apparent distress; mood and affect are within normal limits.  A focused examination was performed including right heal, right superior gluteal crease, left forearm. Relevant physical exam findings are noted in the Assessment and Plan.  Objective  right heel x6 (6): Verrucous papules  Objective  Left Forearm - Anterior: 0.4 cm thin tan papule without suspicious features on dermoscopy  Objective  Gluteal Crease: 0.8 cm pink papule   Assessment & Plan  Viral warts, unspecified type (6) right heel x6  Cantharidin is a blistering agent that comes from a beetle.  It needs to be washed off in about 4 hours after application or sooner if it becomes tender before then.  Although it is painless when applied in office, it may cause symptoms of mild pain and burning several hours later.  Treated areas will swell and turn red, and blisters may form.  Vaseline and a bandaid may be applied until wound has healed.  Once healed, the skin may remain temporarily discolored.  It can take weeks to months for pigmentation to return to normal.   Destruction of lesion - right heel x6  Destruction method: chemical removal   Informed consent: discussed and consent obtained   Timeout:  patient  name, date of birth, surgical site, and procedure verified Chemical destruction method: cantharidin   Chemical destruction method comment:  Plus, Squaric Acid 3% Application time:  4 hours Procedure instructions: patient instructed to wash and dry area   Outcome: patient tolerated procedure well with no complications   Post-procedure details: wound care instructions given    Nevus Left Forearm - Anterior  Benign-appearing.  Observation.  Call clinic for new or changing lesions.  Recommend daily use of broad spectrum spf 30+ sunscreen to sun-exposed areas.   Neoplasm of uncertain behavior Gluteal Crease  Epidermal / dermal shaving  Lesion diameter (cm):  0.8 Informed consent: discussed and consent obtained   Timeout: patient name, date of birth, surgical site, and procedure verified   Patient was prepped and draped in usual sterile fashion: area prepped with isopropyl alcohol. Anesthesia: the lesion was anesthetized in a standard fashion   Anesthetic:  1% lidocaine w/ epinephrine 1-100,000 buffered w/ 8.4% NaHCO3 Instrument used: DermaBlade   Hemostasis achieved with: aluminum chloride   Outcome: patient tolerated procedure well   Post-procedure details: wound care instructions given   Additional details:  Mupirocin and a bandage applied  Specimen 1 - Surgical pathology Differential Diagnosis: r/o recurrent pyogenic granuloma  0.8 cm pink papule  Check Margins: No  Biopsy Proven Pyogenic Granuloma  Discussed treatment shave removal and electrocautery to base vs timolol drops. Will proceed with shave removal + cautery today.   Return in about 1 month (around 12/03/2020) for follow up on wart  and pyogenic granuloma .   I, Ruthell Rummage, CMA, am acting as scribe for Forest Gleason, MD.  Documentation: I have reviewed the above documentation for accuracy and completeness, and I agree with the above.  Forest Gleason, MD

## 2020-11-08 ENCOUNTER — Telehealth: Payer: Self-pay

## 2020-11-08 NOTE — Telephone Encounter (Signed)
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 If recurs, will plan topical timolol drops to treat  MAs please call. Thank you!

## 2020-11-08 NOTE — Telephone Encounter (Signed)
Patient called and given results from biopsy and other recommendations. She verbalized understanding and denied further questions at this time.

## 2020-12-07 ENCOUNTER — Encounter: Payer: Self-pay | Admitting: Dermatology

## 2020-12-07 ENCOUNTER — Ambulatory Visit: Payer: BC Managed Care – PPO | Admitting: Dermatology

## 2020-12-07 ENCOUNTER — Other Ambulatory Visit: Payer: Self-pay

## 2020-12-07 DIAGNOSIS — L814 Other melanin hyperpigmentation: Secondary | ICD-10-CM | POA: Diagnosis not present

## 2020-12-07 DIAGNOSIS — B079 Viral wart, unspecified: Secondary | ICD-10-CM

## 2020-12-07 DIAGNOSIS — L980 Pyogenic granuloma: Secondary | ICD-10-CM | POA: Diagnosis not present

## 2020-12-07 MED ORDER — MUPIROCIN 2 % EX OINT: TOPICAL_OINTMENT | CUTANEOUS | 1 refills | Status: DC

## 2020-12-07 NOTE — Progress Notes (Signed)
   Follow-Up Visit   Subjective  HALAYNA BLANE is a 40 y.o. female who presents for the following: Warts (R heel - patient unsure if any improvement since the area is difficult for her to see) and pyogenic granuloma (Gluteal crease - treated in the past with shave removal and ED, patient has noticed recurrence and would like area checked.).  The following portions of the chart were reviewed this encounter and updated as appropriate:   Tobacco  Allergies  Meds  Problems  Med Hx  Surg Hx  Fam Hx     Review of Systems:  No other skin or systemic complaints except as noted in HPI or Assessment and Plan.  Objective  Well appearing patient in no apparent distress; mood and affect are within normal limits.  A focused examination was performed including the R heel and buttocks. Relevant physical exam findings are noted in the Assessment and Plan.  Objective  Gluteal Crease: 0.9 cm pink papule   Objective  R heel: Clear   Assessment & Plan  Pyogenic granuloma Gluteal Crease  Bx proven - S/P shave removal and ED with recurrence. Symptomatic.  Discussed high rate of recurrence and treatment options of LN2 vs ED&C vs excision vs topical timolol. Recommend ED&C today for best balance of cure rate and low morbidity, may repeat in the future for recurrence. Recommend excision as a last resort due to location.   Start Mupirocin 2% ointment to aa QD until healed.   Destruction of lesion - Gluteal Crease Complexity: extensive   Destruction method: electrodesiccation and curettage   Informed consent: discussed and consent obtained   Timeout:  patient name, date of birth, surgical site, and procedure verified Procedure prep:  Patient was prepped and draped in usual sterile fashion Prep type:  Isopropyl alcohol Anesthesia: the lesion was anesthetized in a standard fashion   Anesthetic:  1% lidocaine w/ epinephrine 1-100,000 buffered w/ 8.4% NaHCO3 Curettage performed in three  different directions: Yes   Electrodesiccation performed over the curetted area: Yes   Margin per side (cm):  0 Final wound size (cm):  1.1 Hemostasis achieved with:  pressure, aluminum chloride and electrodesiccation Outcome: patient tolerated procedure well with no complications   Post-procedure details: sterile dressing applied and wound care instructions given   Dressing type: bandage and petrolatum    mupirocin ointment (BACTROBAN) 2 % - Gluteal Crease  Viral warts, unspecified type R heel  Clear. Observe for recurrence. Call clinic for new or changing lesions.    Lentigines - Scattered tan macules - Due to sun exposure - Benign, observe - Call for any changes  Return if symptoms worsen or fail to improve - patient to RTC PRN recurrence.  Luther Redo, CMA, am acting as scribe for Forest Gleason, MD .  Documentation: I have reviewed the above documentation for accuracy and completeness, and I agree with the above.  Forest Gleason, MD

## 2020-12-07 NOTE — Patient Instructions (Signed)

## 2021-02-14 ENCOUNTER — Ambulatory Visit (INDEPENDENT_AMBULATORY_CARE_PROVIDER_SITE_OTHER): Payer: BC Managed Care – PPO | Admitting: Obstetrics and Gynecology

## 2021-02-14 ENCOUNTER — Other Ambulatory Visit: Payer: Self-pay | Admitting: Obstetrics and Gynecology

## 2021-02-14 ENCOUNTER — Encounter: Payer: Self-pay | Admitting: Obstetrics and Gynecology

## 2021-02-14 ENCOUNTER — Other Ambulatory Visit: Payer: Self-pay

## 2021-02-14 VITALS — BP 136/84 | Ht 66.0 in | Wt 246.0 lb

## 2021-02-14 DIAGNOSIS — N921 Excessive and frequent menstruation with irregular cycle: Secondary | ICD-10-CM | POA: Diagnosis not present

## 2021-02-14 DIAGNOSIS — Z9189 Other specified personal risk factors, not elsewhere classified: Secondary | ICD-10-CM | POA: Diagnosis not present

## 2021-02-14 MED ORDER — NORETHIN ACE-ETH ESTRAD-FE 1-20 MG-MCG PO TABS: 1.0000 | ORAL_TABLET | Freq: Every day | ORAL | 11 refills | Status: DC

## 2021-02-14 MED ORDER — NORETHINDRONE 0.35 MG PO TABS: 1.0000 | ORAL_TABLET | Freq: Every day | ORAL | 11 refills | Status: DC

## 2021-02-14 NOTE — Progress Notes (Signed)
Patient ID: Katie Cross, female   DOB: 1981/04/11, 40 y.o.   MRN: [id number]  Reason for Consult: Gynecologic Exam (Discuss hysterectomy. RM 2)   Referred by Sofie Hartigan, MD  Subjective:     HPI: Katie Cross is a 40 y.o. female . She is here for consultation regarding heavy bleeding and irregular menstrual cycle.  She reports that for the last 5 years her periods have been occurring every 40 to 50 days.  She generally has 6 days of bleeding during her menstrual cycle. She also reports that she has been having having heavy bleeding during her menstrual cycle.  She reports that several times she has passed large palm-sized clots.  She has a sensation of gushing or flooding of blood during her menstrual cycle.  She will have to change a saturated pad or tampon more frequently than every hour.  And she has accidents where she bleeds through her clothes.  She has not been having severe pain during her menstrual cycle which has prevented her from attending work or school. When she has a menstrual cycle she reports that her bleeding is so significant that she is tied to the house for an entire week.  She reports that she has a history of endometriosis that was diagnosed at the age of 89.  She is generally not had significant pain especially since having her children.   She was told after her last cesarean that she had a lot of scar tissue which make it would make it dangerous to have a subsequent cesarean section.  And she has been using a tubal ligation for birth control.  She took oral contraceptive pills in the past between the ages of 64 and 73.   Gynecological History  Patient's last menstrual period was 02/04/2021. Menarche: 11  History of fibroids, polyps, or ovarian cysts? : not sure  History of PCOS? She has suspected she does, but has not been evaluated for this before Hstory of Endometriosis? Yes, reports diagnosis by laparoscopy in 2006 History of abnormal pap  smears? Yes, no history of LEEP or cervical cone, last pap ASCUS HPVnegative  Have you had any sexually transmitted infections in the past? Yes history of chlamydia in college.  She denies HPV vaccination in the past.   Last Pap: Results were: 09/11/2020 ASCUS with NEGATIVE high risk HPV    She identifies as a female. She is sexually active with men.   She denies dyspareunia. She denies postcoital bleeding.  She currently uses tubal ligation for contraception.   She reports a family history significant for breast cancer.  She reports that her father sister had breast cancer diagnosed in the 56s she is currently 34.  She has a history of colon cancer as well.  She had bilateral breast cancer She reports that her maternal aunt had breast cancer at 46 and is currently 39 years old. A second maternal aunt had breast cancer diagnosed at 5 and is currently 39 years old.   Past Medical History:  Diagnosis Date  . Anemia   . Appendicitis   . Dysplastic nevus 08/07/2010   left med lower leg mild to moderate, left buttock moderate atypia  . Dysplastic nevus 12/12/2015   right upper abdomen, halo nevus with mild atypia, limited margins free.  Marland Kitchen Dysplastic nevus 06/23/2018   left spinal lower back, moderate atypia, margins free.  . Family history of breast cancer    pt declines MyRisk testing  . Pelvic pain  Family History  Problem Relation Age of Onset  . Hypertension Mother   . Hyperlipidemia Mother   . Hypertension Father   . Hyperlipidemia Father   . Breast cancer Maternal Aunt 60  . Brain cancer Maternal Aunt   . Breast cancer Paternal Aunt 35       BRCA neg  . Colon cancer Paternal Aunt 103  . Diabetes Mellitus II Paternal Grandfather    Past Surgical History:  Procedure Laterality Date  . APPENDECTOMY    . CESAREAN SECTION    . LAPAROSCOPY    . TUBAL LIGATION      Short Social History:  Social History   Tobacco Use  . Smoking status: Never Smoker  . Smokeless  tobacco: Never Used  Substance Use Topics  . Alcohol use: No    No Known Allergies  Current Outpatient Medications  Medication Sig Dispense Refill  . aspirin 81 MG EC tablet Take 1 tablet by mouth daily.    . mupirocin ointment (BACTROBAN) 2 % Apply 1 application topically 2 (two) times daily. (Patient not taking: Reported on 12/07/2020) 22 g 0  . mupirocin ointment (BACTROBAN) 2 % Apply to aa QD-BID until healed (Patient not taking: Reported on 02/14/2021) 22 g 1   No current facility-administered medications for this visit.    Review of Systems  Constitutional: Negative for chills, fatigue, fever and unexpected weight change.  HENT: Negative for trouble swallowing.  Eyes: Negative for loss of vision.  Respiratory: Negative for cough, shortness of breath and wheezing.  Cardiovascular: Negative for chest pain, leg swelling, palpitations and syncope.  GI: Negative for abdominal pain, blood in stool, diarrhea, nausea and vomiting.  GU: Negative for difficulty urinating, dysuria, frequency and hematuria.  Musculoskeletal: Negative for back pain, leg pain and joint pain.  Skin: Negative for rash.  Neurological: Negative for dizziness, headaches, light-headedness, numbness and seizures.  Psychiatric: Negative for behavioral problem, confusion, depressed mood and sleep disturbance.        Objective:  Objective   Vitals:   02/14/21 0916  BP: 136/84  Weight: 246 lb (111.6 kg)  Height: 5' 6"  (1.676 m)   Body mass index is 39.71 kg/m.  Physical Exam Vitals and nursing note reviewed. Exam conducted with a chaperone present.  Constitutional:      Appearance: Normal appearance. She is well-developed.  HENT:     Head: Normocephalic and atraumatic.  Eyes:     Extraocular Movements: Extraocular movements intact.     Pupils: Pupils are equal, round, and reactive to light.  Cardiovascular:     Rate and Rhythm: Normal rate and regular rhythm.  Pulmonary:     Effort: Pulmonary effort  is normal. No respiratory distress.     Breath sounds: Normal breath sounds.  Abdominal:     General: Abdomen is flat.     Palpations: Abdomen is soft.  Genitourinary:    Comments: External: Normal appearing vulva. No lesions noted.  Speculum examination: Normal appearing cervix. No blood in the vaginal vault. NO discharge.   Bimanual examination: Uterus midline, slightly enlarged.  No CMT. No adnexal masses. No adnexal tenderness. Pelvis not fixed.  Breast exam: exam not performed Musculoskeletal:        General: No signs of injury.  Skin:    General: Skin is warm and dry.  Neurological:     Mental Status: She is alert and oriented to person, place, and time.  Psychiatric:        Behavior: Behavior normal.  Thought Content: Thought content normal.        Judgment: Judgment normal.     Assessment/Plan:     40 yo with menorrhagia and irregular menstrual cycle  1. Will check cbc, tsh, prolactin and ferritin. Patient will follow up for a pelvic US. Discussed some management options for irregular and heavy bleeding which she would consider prior to an hysterectomy including: OCP, IUD, and endometrial ablation. She would like to trial an OCP.   Discussed risk factors with patient further after office on phone. Family history of factor V Leiden and elevated BP on recent visits. She will follow up with her PCP regarding HTN. Will switch to a POP.   2. At risk for sleep apnea: referral placed for sleep study.   3. Breast cancer screening, family risk is elevated. Orders for Mammogram and breast MRI placed. Given information on myrisk testing. She will consider.  IBIS score:    Will follow up after pelvic US to review the results and discuss plan of care further.  Will consider endometrial biopsy given some risk factors for hyperlasia and endometrial cancer.   More than 40 minutes were spent face to face with the patient in the room, reviewing the medical record, labs and  images, and coordinating care for the patient. The plan of management was discussed in detail and counseling was provided.       Adrian Prows MD [facility], [facility] 02/14/2021 9:38 AM

## 2021-02-14 NOTE — Telephone Encounter (Signed)
Called and discussed with patient. Sent rx for POP.

## 2021-02-15 LAB — FERRITIN: Ferritin: 159 ng/mL — ABNORMAL HIGH (ref 15–150)

## 2021-02-15 LAB — TSH: TSH: 1.97 u[IU]/mL (ref 0.450–4.500)

## 2021-02-15 LAB — PROLACTIN: Prolactin: 8.2 ng/mL (ref 4.8–23.3)

## 2021-02-19 ENCOUNTER — Telehealth: Payer: Self-pay

## 2021-02-19 DIAGNOSIS — I83813 Varicose veins of bilateral lower extremities with pain: Secondary | ICD-10-CM

## 2021-02-19 NOTE — Telephone Encounter (Signed)
This patient sent a message to [place]: "I'm trying to get an appointment scheduled with a vein specialist to look at a varicose vein on my right leg. I can't get anything scheduled without a referral from a doctor. Dr. Laurence Ferrari and Dr. Nicole Kindred have both told me in the past that they could make a referral for me."  This is a current patient of yours but the last I can see her veins being discussed is in [place].   Please advise.

## 2021-02-20 NOTE — Telephone Encounter (Signed)
Referral placed and left detailed message advising patient.

## 2021-02-20 NOTE — Telephone Encounter (Signed)
Please send referral to vein specialist for varicose veins. Thank you!

## 2021-03-13 ENCOUNTER — Ambulatory Visit
Admission: RE | Admit: 2021-03-13 | Discharge: 2021-03-13 | Disposition: A | Discharge status: Home / Self Care | Payer: BC Managed Care – PPO | Source: Ambulatory Visit | Attending: Obstetrics and Gynecology | Admitting: Obstetrics and Gynecology

## 2021-03-13 ENCOUNTER — Other Ambulatory Visit: Payer: Self-pay

## 2021-03-13 DIAGNOSIS — Z9189 Other specified personal risk factors, not elsewhere classified: Secondary | ICD-10-CM

## 2021-03-13 DIAGNOSIS — Z1231 Encounter for screening mammogram for malignant neoplasm of breast: Secondary | ICD-10-CM | POA: Diagnosis not present

## 2021-03-23 ENCOUNTER — Ambulatory Visit
Admission: RE | Admit: 2021-03-23 | Discharge: 2021-03-23 | Disposition: A | Discharge status: Home / Self Care | Payer: BC Managed Care – PPO | Source: Ambulatory Visit | Attending: Obstetrics and Gynecology | Admitting: Obstetrics and Gynecology

## 2021-03-23 ENCOUNTER — Other Ambulatory Visit (INDEPENDENT_AMBULATORY_CARE_PROVIDER_SITE_OTHER): Payer: Self-pay | Admitting: Vascular Surgery

## 2021-03-23 ENCOUNTER — Other Ambulatory Visit: Payer: Self-pay | Admitting: Obstetrics and Gynecology

## 2021-03-23 ENCOUNTER — Other Ambulatory Visit: Payer: Self-pay

## 2021-03-23 DIAGNOSIS — N921 Excessive and frequent menstruation with irregular cycle: Secondary | ICD-10-CM | POA: Insufficient documentation

## 2021-03-23 DIAGNOSIS — I83813 Varicose veins of bilateral lower extremities with pain: Secondary | ICD-10-CM

## 2021-03-26 ENCOUNTER — Encounter: Payer: Self-pay | Admitting: Obstetrics and Gynecology

## 2021-03-26 ENCOUNTER — Other Ambulatory Visit: Payer: Self-pay

## 2021-03-26 ENCOUNTER — Ambulatory Visit (INDEPENDENT_AMBULATORY_CARE_PROVIDER_SITE_OTHER): Payer: BC Managed Care – PPO | Admitting: Obstetrics and Gynecology

## 2021-03-26 VITALS — BP 146/86 | Ht 66.0 in | Wt 246.0 lb

## 2021-03-26 DIAGNOSIS — Z832 Family history of diseases of the blood and blood-forming organs and certain disorders involving the immune mechanism: Secondary | ICD-10-CM

## 2021-03-26 DIAGNOSIS — N921 Excessive and frequent menstruation with irregular cycle: Secondary | ICD-10-CM | POA: Diagnosis not present

## 2021-03-26 NOTE — Patient Instructions (Signed)
Hysterectomy Information °A hysterectomy is a surgery in which the uterus is removed. The lowest part of the uterus (cervix), which opens into the vagina, may be removed as well. In some cases, the fallopian tubes, the ovaries,  or both the fallopian tubes and the ovaries may also be removed. °This procedure may be done to treat different medical problems. It may also be done to help transgender men feel more masculine. After the procedure, a woman will no longer have menstrual periods and will not be able to become pregnant (sterile). °What are the reasons for a hysterectomy? °There are many reasons why a person might have this procedure. They include: °Persistent, abnormal vaginal bleeding. °Long-term (chronic) pelvic pain or infection. °Endometriosis. This is when the lining of the uterus (endometrium) starts to grow outside the uterus. °Adenomyosis. This is when the endometrium starts to grow in the muscle of the uterus. °Pelvic organ prolapse. This is a condition in which the uterus falls down into the vagina. °Noncancerous growths in the uterus (uterine fibroids) that cause symptoms. °The presence of precancerous cells. °Cervical or uterine cancer. °Sex change. This helps a transgender man complete his female identity. °What are the different types of hysterectomy? °There are three different types of hysterectomy: °Supracervical hysterectomy. In this type, the top part of the uterus is removed, but not the cervix. °Total hysterectomy. In this type, the uterus and cervix are removed. °Radical hysterectomy. In this type, the uterus, the cervix, and the tissue that holds the uterus in place (parametrium) are removed. °What are the different ways a hysterectomy can be performed? °There are many different ways a hysterectomy can be performed, including: °Abdominal hysterectomy. In this type, an incision is made in the abdomen. The uterus is removed through this incision. °Vaginal hysterectomy. In this type, an  incision is made in the vagina. The uterus is removed through this incision. There are no abdominal incisions. °Conventional laparoscopic hysterectomy. In this type, 3 or 4 small incisions are made in the abdomen. A thin, lighted tube with a camera (laparoscope) is inserted into one of the incisions. Other tools are put through the other incisions. The uterus is cut into small pieces. The small pieces are removed through the incisions or the vagina. °Laparoscopically assisted vaginal hysterectomy (LAVH). In this type, 3 or 4 small incisions are made in the abdomen. Part of the surgery is performed laparoscopically and the other part is done vaginally. The uterus is removed through the vagina. °Robot-assisted laparoscopic hysterectomy. In this type, a laparoscope and other tools are inserted into 3 or 4 small incisions in the abdomen. A computer-controlled device is used to give the surgeon a 3D image and to help control the surgical instruments. This allows for more precise movements of surgical instruments. The uterus is cut into small pieces and removed through the incisions or the vagina. °Discuss the options with your health care provider to determine which type is the right one for you. °What are the risks of this surgery? °Generally, this is a safe procedure. However, problems may occur, including: °Bleeding and risk of blood transfusion. Tell your health care provider if you do not want to receive any blood products. °Blood clots in the legs or lung. °Infection. °Damage to nearby structures or organs. °Allergic reactions to medicines. °Having to change to an abdominal hysterectomy after starting a less invasive technique. °What to expect after a hysterectomy °You will be given pain medicine. °You may need to stay in the hospital for 1-2 days   to recover, depending on the type of hysterectomy you had. °You will need to have someone with you for the first 3-5 days after you go home. °You will need to follow up  with your surgeon in 2-4 weeks after surgery to evaluate your progress. °If the ovaries are removed, you will have early menopause symptoms such as hot flashes, night sweats, and insomnia. °If you had a hysterectomy for a problem that was not cancer or a condition that could not lead to cancer, then you no longer need Pap tests. However, even if you no longer need a Pap test, get regular pelvic exams to make sure no other problems are developing. °Questions to ask your health care provider °Is a hysterectomy medically necessary? Do I have other treatment options for my condition? °What are my options for hysterectomy procedure? °What organs and tissues need to be removed? °What are the risks? °What are the benefits? °How long will I need to stay in the hospital after the procedure? °How long will I need to recover at home? °What symptoms can I expect after the procedure? °Summary °A hysterectomy is a surgery in which the uterus is removed. The fallopian tubes, the ovaries, or both may be removed as well. °This procedure may be done to treat different medical problems. It may also be done to complete your female identity during a sex change. °After the procedure, a woman will no longer have a menstrual period and will not be able to become pregnant. °There are three types of hysterectomy. Discuss with your health care provider which options are right for you. °This is a safe procedure, though there are some risks. Risks include infection, bleeding, blood clots, or damage to nearby organs. °This information is not intended to replace advice given to you by your health care provider. Make sure you discuss any questions you have with your health care provider. °Document Revised: 09/10/2019 Document Reviewed: 09/10/2019 °Elsevier Patient Education © 2021 [employer] ° °

## 2021-03-26 NOTE — Progress Notes (Signed)
Patient ID: Katie Cross, female   DOB: [birth year], 40 y.o.   MRN: [id number]  Reason for Consult: No chief complaint on file.   Referred by Sofie Hartigan, MD  Subjective:     HPI:  Katie Cross is a 40 y.o. female.  She is following up today for menorrhagia, irregular menstrual cycle and pelvic US.  She reports that with taking her progesterone only pill she has noticed a decrease in the amount of bleeding but now she is having uterine bleeding every 2 weeks.  The bleeding is not  Light enough to be managed with a panty liner but requires a tampon.  She is not completely happy with this menstrual bleeding pattern and does not like the idea of taking pill every day to control her bleeding.   Past Medical History:  Diagnosis Date  . Anemia   . Appendicitis   . Dysplastic nevus 08/07/2010   left med lower leg mild to moderate, left buttock moderate atypia  . Dysplastic nevus 12/12/2015   right upper abdomen, halo nevus with mild atypia, limited margins free.  Marland Kitchen Dysplastic nevus 06/23/2018   left spinal lower back, moderate atypia, margins free.  . Family history of breast cancer    pt declines MyRisk testing  . Pelvic pain    Family History  Problem Relation Age of Onset  . Hypertension Mother   . Hyperlipidemia Mother   . Hypertension Father   . Hyperlipidemia Father   . Breast cancer Maternal Aunt 60  . Brain cancer Maternal Aunt   . Breast cancer Paternal Aunt 61       BRCA neg  . Colon cancer Paternal Aunt 1  . Diabetes Mellitus II Paternal Grandfather    Past Surgical History:  Procedure Laterality Date  . APPENDECTOMY    . CESAREAN SECTION    . LAPAROSCOPY    . TUBAL LIGATION      Short Social History:  Social History   Tobacco Use  . Smoking status: Never Smoker  . Smokeless tobacco: Never Used  Substance Use Topics  . Alcohol use: No    No Known Allergies  Current Outpatient Medications  Medication Sig Dispense Refill  . aspirin 81 MG  EC tablet Take 1 tablet by mouth daily.    . mupirocin ointment (BACTROBAN) 2 % Apply 1 application topically 2 (two) times daily. (Patient not taking: Reported on 12/07/2020) 22 g 0  . mupirocin ointment (BACTROBAN) 2 % Apply to aa QD-BID until healed (Patient not taking: Reported on 02/14/2021) 22 g 1  . norethindrone (MICRONOR) 0.35 MG tablet Take 1 tablet (0.35 mg total) by mouth daily. 28 tablet 11   No current facility-administered medications for this visit.    Review of Systems  Constitutional: Negative for chills, fatigue, fever and unexpected weight change.  HENT: Negative for trouble swallowing.  Eyes: Negative for loss of vision.  Respiratory: Negative for cough, shortness of breath and wheezing.  Cardiovascular: Negative for chest pain, leg swelling, palpitations and syncope.  GI: Negative for abdominal pain, blood in stool, diarrhea, nausea and vomiting.  GU: Negative for difficulty urinating, dysuria, frequency and hematuria.  Musculoskeletal: Negative for back pain, leg pain and joint pain.  Skin: Negative for rash.  Neurological: Negative for dizziness, headaches, light-headedness, numbness and seizures.  Psychiatric: Negative for behavioral problem, confusion, depressed mood and sleep disturbance.        Objective:  Objective   There were no vitals filed for this  visit. There is no height or weight on file to calculate BMI.  Physical Exam Vitals and nursing note reviewed. Exam conducted with a chaperone present.  Constitutional:      Appearance: Normal appearance.  HENT:     Head: Normocephalic and atraumatic.  Eyes:     Extraocular Movements: Extraocular movements intact.     Pupils: Pupils are equal, round, and reactive to light.  Cardiovascular:     Rate and Rhythm: Normal rate and regular rhythm.  Pulmonary:     Effort: Pulmonary effort is normal.     Breath sounds: Normal breath sounds.  Abdominal:     General: Abdomen is flat.     Palpations: Abdomen  is soft.  Musculoskeletal:     Cervical back: Normal range of motion.  Skin:    General: Skin is warm and dry.  Neurological:     General: No focal deficit present.     Mental Status: She is alert and oriented to person, place, and time.  Psychiatric:        Behavior: Behavior normal.        Thought Content: Thought content normal.        Judgment: Judgment normal.     Assessment/Plan:     39-year-old 1.  Menorrhagia- Reviewed options for management. Discussed 3 month trial of POP, IUD, nexplanon, and endometrial ablation as options prior to a hysterectomy. Discussed risks associated with hysterectomy and why it can be advantageous to avoid surgery. Discussed that risk risk of surgery can include risk of bleeding risk of infection risk of damage to any surrounding tissues including injury to the bowel bladder or ureters.  Discussed risks of clots that can occur with surgery.  Discussed that she has a history of significant adhesions with her prior C-sections which would likely add a degree of difficulty to her procedure and increase her risk of damage to surrounding pelvic tissues.  She is most interested at this time and having a hysterectomy and would like to move forward with that surgical planning. 2.  She has a family history of factor V Leiden and would like factor V testing today.  Ordered the labs and gave the patient a handout she will need to go to the [facility] facility to have these drawn as we do not have the correct tubes in house. 3.  Elevated blood pressures today consistent with hypertension -encouraged the patient to follow-up with her PCP regarding starting medication     More than 20 minutes were spent face to face with the patient in the room, reviewing the medical record, labs and images, and coordinating care for the patient. The plan of management was discussed in detail and counseling was provided.    Christanna Schuman MD [facility], [facility] 03/26/2021 1:41 PM   

## 2021-03-27 ENCOUNTER — Other Ambulatory Visit: Payer: Self-pay | Admitting: Obstetrics and Gynecology

## 2021-03-27 ENCOUNTER — Ambulatory Visit (INDEPENDENT_AMBULATORY_CARE_PROVIDER_SITE_OTHER): Payer: BC Managed Care – PPO | Admitting: Vascular Surgery

## 2021-03-27 ENCOUNTER — Ambulatory Visit (INDEPENDENT_AMBULATORY_CARE_PROVIDER_SITE_OTHER): Payer: BC Managed Care – PPO

## 2021-03-27 ENCOUNTER — Encounter (INDEPENDENT_AMBULATORY_CARE_PROVIDER_SITE_OTHER): Payer: Self-pay | Admitting: Vascular Surgery

## 2021-03-27 VITALS — BP 142/91 | HR 84 | Resp 16 | Ht 66.0 in | Wt 244.0 lb

## 2021-03-27 DIAGNOSIS — D649 Anemia, unspecified: Secondary | ICD-10-CM | POA: Diagnosis not present

## 2021-03-27 DIAGNOSIS — I83813 Varicose veins of bilateral lower extremities with pain: Secondary | ICD-10-CM

## 2021-03-27 NOTE — Assessment & Plan Note (Signed)
To evaluate her venous system duplex was performed today.  This demonstrates no evidence of DVT or superficial thrombophlebitis.  Both great saphenous veins have severe long segment reflux as does the large varicosity/branch of the great saphenous vein on the right leg.  Recommend:  The patient has large symptomatic varicose veins that are painful and associated with swelling.  I have had a long discussion with the patient regarding  varicose veins and why they cause symptoms.  Patient will begin wearing graduated compression stockings class 1 on a daily basis, beginning first thing in the morning and removing them in the evening. The patient is instructed specifically not to sleep in the stockings.    The patient  will also begin using over-the-counter analgesics such as Motrin 600 mg po TID to help control the symptoms.    In addition, behavioral modification including elevation during the day will be initiated.    Pending the results of these changes the  patient will be reevaluated in three months.

## 2021-03-27 NOTE — Assessment & Plan Note (Signed)
Blood loss with procedure would be minimal.

## 2021-03-27 NOTE — Patient Instructions (Signed)
Varicose Veins Varicose veins are veins that have become enlarged, bulged, and twisted. They most often appear in the legs. What are the causes? This condition is caused by damage to the valves in the vein. These valves help blood return to your heart. When they are damaged and they stop working properly, blood may flow backward and back up in the veins near the skin, causing the veins to get larger and appear twisted. The condition can result from any issue that causes blood to back up, like pregnancy, prolonged standing, or obesity. What increases the risk? This condition is more likely to develop in people who are:  On their feet a lot.  Pregnant.  Overweight. What are the signs or symptoms? Symptoms of this condition include:  Bulging, twisted, and bluish veins.  A feeling of heaviness. This may be worse at the end of the day.  Leg pain. This may be worse at the end of the day.  Swelling in the leg.  Changes in skin color over the veins. How is this diagnosed? This condition may be diagnosed based on your symptoms, a physical exam, and an ultrasound test. How is this treated? Treatment for this condition may involve:  Avoiding sitting or standing in one position for long periods of time.  Wearing compression stockings. These stockings help to prevent blood clots and reduce swelling in the legs.  Raising (elevating) the legs when resting.  Losing weight.  Exercising regularly. If you have persistent symptoms or want to improve the way your varicose veins look, you may choose to have a procedure to close the varicose veins off or to remove them. Treatments to close off the veins include:  Sclerotherapy. In this treatment, a solution is injected into a vein to close it off.  Laser treatment. In this treatment, the vein is heated with a laser to close it off.  Radiofrequency vein ablation. In this treatment, an electrical current produced by radio waves is used to close  off the vein. Treatments to remove the veins include:  Phlebectomy. In this treatment, the veins are removed through small incisions made over the veins.  Vein ligation and stripping. In this treatment, incisions are made over the veins. The veins are then removed after being tied (ligated) with stitches (sutures). Follow these instructions at home: Activity  Walk as much as possible. Walking increases blood flow. This helps blood return to the heart and takes pressure off your veins. It also increases your cardiovascular strength.  Follow your health care provider's instructions about exercising.  Do not stand or sit in one position for a long period of time.  Do not sit with your legs crossed.  Rest with your legs raised during the day. General instructions  Follow any diet instructions given to you by your health care provider.  Wear compression stockings as directed by your health care provider. Do not wear other kinds of tight clothing around your legs, pelvis, or waist.  Elevate your legs at night to above the level of your heart.  If you get a cut in the skin over the varicose vein and the vein bleeds: ? Lie down with your leg raised. ? Apply firm pressure to the cut with a clean cloth until the bleeding stops. ? Place a bandage (dressing) on the cut.   Contact a health care provider if:  The skin around your varicose veins starts to break down.  You have pain, redness, tenderness, or hard swelling over a vein.  You are uncomfortable because of pain.  You get a cut in the skin over a varicose vein and it will not stop bleeding. Summary  Varicose veins are veins that have become enlarged, bulged, and twisted. They most often appear in the legs.  This condition is caused by damage to the valves in the vein. These valves help blood return to your heart.  Treatment for this condition includes frequent movements, wearing compression stockings, losing weight, and  exercising regularly. In some cases, procedures are done to close off or remove the veins.  Treatment for this condition may include wearing compression stockings, elevating the legs, losing weight, and engaging in regular activity. In some cases, procedures are done to close off or remove the veins. This information is not intended to replace advice given to you by your health care provider. Make sure you discuss any questions you have with your health care provider. Document Revised: 03/23/2020 Document Reviewed: 03/23/2020 Elsevier Patient Education  [place].

## 2021-03-27 NOTE — Progress Notes (Signed)
Patient ID: Katie Cross, female   DOB: 1981/02/03, 40 y.o.   MRN: [id number]  Chief Complaint  Patient presents with  . New Patient (Initial Visit)    Ref Moye varicose veins with pain    HPI Katie Cross is a 40 y.o. female.  I am asked to see the patient by Dr. Laurence Ferrari for evaluation of painful varicose veins of the lower extremities.  She has had prominent varicosities present since her first pregnancy 14 years ago.  These have gradually worsened over time and now are creating more pain and swelling.  She denies any history of DVT or superficial thrombophlebitis to her knowledge.  The most prominent varicosity courses just from just medial to her right lower thigh across the knee and into the right lateral calf.  She has itching and burning as well as pain overlying the varicosities.  She tries to elevate her legs.  She has worn compression stockings in the past but they tend to cause irritation at the site of the varicosities just below her knee.  They did not help a whole lot either.  To evaluate her venous system duplex was performed today.  This demonstrates no evidence of DVT or superficial thrombophlebitis.  Both great saphenous veins have severe long segment reflux as does the large varicosity/branch of the great saphenous vein on the right leg.   Past Medical History:  Diagnosis Date  . Anemia   . Appendicitis   . Dysplastic nevus 08/07/2010   left med lower leg mild to moderate, left buttock moderate atypia  . Dysplastic nevus 12/12/2015   right upper abdomen, halo nevus with mild atypia, limited margins free.  Marland Kitchen Dysplastic nevus 06/23/2018   left spinal lower back, moderate atypia, margins free.  . Family history of breast cancer    pt declines MyRisk testing  . Pelvic pain     Past Surgical History:  Procedure Laterality Date  . APPENDECTOMY    . CESAREAN SECTION    . LAPAROSCOPY    . TUBAL LIGATION       Family History  Problem Relation Age of Onset  .  Hypertension Mother   . Hyperlipidemia Mother   . Hypertension Father   . Hyperlipidemia Father   . Breast cancer Maternal Aunt 60  . Brain cancer Maternal Aunt   . Breast cancer Paternal Aunt 61       BRCA neg  . Colon cancer Paternal Aunt 62  . Diabetes Mellitus II Paternal Grandfather      Social History   Tobacco Use  . Smoking status: Never Smoker  . Smokeless tobacco: Never Used  Vaping Use  . Vaping Use: Never used  Substance Use Topics  . Alcohol use: No  . Drug use: No    No Known Allergies  Current Outpatient Medications  Medication Sig Dispense Refill  . aspirin 81 MG EC tablet Take 1 tablet by mouth daily.    . norethindrone (MICRONOR) 0.35 MG tablet Take 1 tablet (0.35 mg total) by mouth daily. 28 tablet 11  . mupirocin ointment (BACTROBAN) 2 % Apply 1 application topically 2 (two) times daily. (Patient not taking: No sig reported) 22 g 0  . mupirocin ointment (BACTROBAN) 2 % Apply to aa QD-BID until healed (Patient not taking: No sig reported) 22 g 1   No current facility-administered medications for this visit.      REVIEW OF SYSTEMS (Negative unless checked)  Constitutional: [] Weight loss  [] Fever  [] Chills  Cardiac: [] Chest pain   [] Chest pressure   [] Palpitations   [] Shortness of breath when laying flat   [] Shortness of breath at rest   [] Shortness of breath with exertion. Vascular:  [] Pain in legs with walking   [] Pain in legs at rest   [] Pain in legs when laying flat   [] Claudication   [] Pain in feet when walking  [] Pain in feet at rest  [] Pain in feet when laying flat   [] History of DVT   [] Phlebitis   [x] Swelling in legs   [x] Varicose veins   [] Non-healing ulcers Pulmonary:   [] Uses home oxygen   [] Productive cough   [] Hemoptysis   [] Wheeze  [] COPD   [] Asthma Neurologic:  [] Dizziness  [] Blackouts   [] Seizures   [] History of stroke   [] History of TIA  [] Aphasia   [] Temporary blindness   [] Dysphagia   [] Weakness or numbness in arms   [] Weakness or  numbness in legs Musculoskeletal:  [] Arthritis   [] Joint swelling   [] Joint pain   [] Low back pain Hematologic:  [] Easy bruising  [] Easy bleeding   [] Hypercoagulable state   [x] Anemic  [] Hepatitis Gastrointestinal:  [] Blood in stool   [] Vomiting blood  [] Gastroesophageal reflux/heartburn   [] Abdominal pain Genitourinary:  [] Chronic kidney disease   [] Difficult urination  [] Frequent urination  [] Burning with urination   [] Hematuria Skin:  [] Rashes   [] Ulcers   [] Wounds Psychological:  [] History of anxiety   []  History of major depression.    Physical Exam BP (!) 142/91 (BP Location: Left Arm)   Pulse 84   Resp 16   Ht 5' 6"  (1.676 m)   Wt 244 lb (110.7 kg)   LMP 03/23/2021   BMI 39.38 kg/m  Gen:  WD/WN, NAD Head: [place]/AT, No temporalis wasting.  Ear/Nose/Throat: Hearing grossly intact, nares w/o erythema or drainage, oropharynx w/o Erythema/Exudate Eyes: Conjunctiva clear, sclera non-icteric  Neck: trachea midline.  No JVD.  Pulmonary:  Good air movement, respirations not labored, no use of accessory muscles  Cardiac: RRR, no JVD Vascular: Fairly large, 3 to 5 mm extensive varicosities are present in the right medial thigh, knee, and lateral calf area with significant varicosities in the lower leg.  She has varicosities on the left leg although they are far less prominent. Vessel Right Left  Radial Palpable Palpable                                   Gastrointestinal:. No masses, surgical incisions, or scars. Musculoskeletal: M/S 5/5 throughout.  Extremities without ischemic changes.  No deformity or atrophy. Mild RLE edema. Neurologic: Sensation grossly intact in extremities.  Symmetrical.  Speech is fluent. Motor exam as listed above. Psychiatric: Judgment intact, Mood & affect appropriate for pt's clinical situation. Dermatologic: No rashes or ulcers noted.  No cellulitis or open wounds.    Radiology MM 3D SCREEN BREAST BILATERAL  Result Date: 03/13/2021 CLINICAL DATA:   Screening. EXAM: DIGITAL SCREENING BILATERAL MAMMOGRAM WITH TOMOSYNTHESIS AND CAD TECHNIQUE: Bilateral screening digital craniocaudal and mediolateral oblique mammograms were obtained. Bilateral screening digital breast tomosynthesis was performed. The images were evaluated with computer-aided detection. COMPARISON:  None. ACR Breast Density Category c: The breast tissue is heterogeneously dense, which may obscure small masses FINDINGS: There are no findings suspicious for malignancy. The images were evaluated with computer-aided detection. IMPRESSION: No mammographic evidence of malignancy. A result letter of this screening mammogram will be mailed directly to the patient. RECOMMENDATION:  Screening mammogram at age 50. (Code:SM-B-40A) BI-RADS CATEGORY  1: Negative. Electronically Signed   By: Nolon Nations M.D.   On: 03/13/2021 11:48   US PELVIC COMPLETE WITH TRANSVAGINAL  Result Date: 03/23/2021 CLINICAL DATA:  Menorrhagia EXAM: TRANSABDOMINAL AND TRANSVAGINAL ULTRASOUND OF PELVIS TECHNIQUE: Both transabdominal and transvaginal ultrasound examinations of the pelvis were performed. Transabdominal technique was performed for global imaging of the pelvis including uterus, ovaries, adnexal regions, and pelvic cul-de-sac. It was necessary to proceed with endovaginal exam following the transabdominal exam to visualize the ovaries. COMPARISON:  None FINDINGS: Uterus Measurements: 9.3 x 4.4 x 5.1 cm = volume: 108 mL. No fibroids or other mass visualized. Endometrium Thickness: 5 mm.  No focal abnormality visualized. Right ovary Measurements: 4.2 x 2.5 x 2.7 cm = volume: 15 mL. Normal appearance/no adnexal mass. Left ovary Measurements: 3.4 x 2.1 x 2.3 cm = volume: 9 mL. Normal appearance/no adnexal mass. Other findings No abnormal free fluid. IMPRESSION: Unremarkable exam. Electronically Signed   By: Constance Holster M.D.   On: 03/23/2021 17:05    Labs Recent Results (from the past 2160 hour(s))  TSH      Status: None   Collection Time: 02/14/21 10:32 AM  Result Value Ref Range   TSH 1.970 0.450 - 4.500 uIU/mL  Prolactin     Status: None   Collection Time: 02/14/21 10:32 AM  Result Value Ref Range   Prolactin 8.2 4.8 - 23.3 ng/mL  Ferritin     Status: Abnormal   Collection Time: 02/14/21 10:32 AM  Result Value Ref Range   Ferritin 159 (H) 15 - 150 ng/mL    Assessment/Plan:  Varicose veins of leg with pain, bilateral To evaluate her venous system duplex was performed today.  This demonstrates no evidence of DVT or superficial thrombophlebitis.  Both great saphenous veins have severe long segment reflux as does the large varicosity/branch of the great saphenous vein on the right leg.  Recommend:  The patient has large symptomatic varicose veins that are painful and associated with swelling.  I have had a long discussion with the patient regarding  varicose veins and why they cause symptoms.  Patient will begin wearing graduated compression stockings class 1 on a daily basis, beginning first thing in the morning and removing them in the evening. The patient is instructed specifically not to sleep in the stockings.    The patient  will also begin using over-the-counter analgesics such as Motrin 600 mg po TID to help control the symptoms.    In addition, behavioral modification including elevation during the day will be initiated.    Pending the results of these changes the  patient will be reevaluated in three months.    Anemia Blood loss with procedure would be minimal.      Leotis Pain 03/27/2021, 12:44 PM   This note was created with Dragon medical transcription system.  Any errors from dictation are unintentional.

## 2021-03-28 DIAGNOSIS — R030 Elevated blood-pressure reading, without diagnosis of hypertension: Secondary | ICD-10-CM | POA: Insufficient documentation

## 2021-03-30 ENCOUNTER — Telehealth: Payer: Self-pay

## 2021-03-30 NOTE — Telephone Encounter (Signed)
-----   Message from Homero Fellers, MD sent at 03/26/2021  2:17 PM EDT ----- Surgery Booking Request Patient Full Name:  Katie Cross  MRN: [id number]  DOB: [birth year]  Surgeon: Homero Fellers, MD  Requested Surgery Date and Time: Katie Cross or May 2022 Primary Diagnosis AND Code: Menorrhagia with irregular cycle Secondary Diagnosis and Code:  Surgical Procedure: robotic assisted laparoscopic hysterectomy, bilateral salpingectomy, cystoscopy RNFA Requested?: Yes L&D Notification: No Admission Status: same day surgery Length of Surgery: 50 min Special Case Needs: No H&P: Yes Phone Interview???:  Yes Interpreter: No Medical Clearance:  No Special Scheduling Instructions: No Any known health/anesthesia issues, diabetes, sleep apnea, latex allergy, defibrillator/pacemaker?: Yes Acuity: P2   (P1 highest, P2 delay may cause harm, P3 low, elective gyn, P4 lowest)

## 2021-03-30 NOTE — Telephone Encounter (Signed)
Called patient to schedule robotic assisted laparoscopic hysterectomy, bilateral salpingectomy, cystoscopy  DOS 09/13/21  H&P 10/10 @ 10:10   I told patient I would follow up with her later in the year as PAT is not scheduling appts out that far and they may be doing in person again at that time.   Pt asked about if her annual could be done during her H&P. She had her last one 09/11/20 with Gigi Gin but would rather schedule with [facility] since she will be doing her surgery. I adv that this is an unique situation that has not come up and that I would rather discuss it with Schuman. She did say that if Gilman Schmidt feels that she needs the surgery earlier that was fine.   Advised that pt may also receive calls from the hospital pharmacy and pre-service center.  Confirmed pt has [employer] as Chartered certified accountant. No secondary insurance.

## 2021-03-30 NOTE — Telephone Encounter (Signed)
I'm happy to do her annual, but it would need to be a separate visit. It's too much different stuff to do all at one time.  If that's the best time for her to have surgery then this is okay with me, this is mostly just related to her bleeding and pain.  Thank you, Cardelia Sassano

## 2021-04-02 LAB — FACTOR V INHIBITOR
APTT: 25.4 s
Factor V Activity: 141 %
Prothrombin Time: 9.7 s

## 2021-04-02 LAB — CBC
Hematocrit: 40.6 % (ref 34.0–46.6)
Hemoglobin: 13.5 g/dL (ref 11.1–15.9)
MCH: 29.6 pg (ref 26.6–33.0)
MCHC: 33.3 g/dL (ref 31.5–35.7)
MCV: 89 fL (ref 79–97)
Platelets: 380 10*3/uL (ref 150–450)
RBC: 4.56 x10E6/uL (ref 3.77–5.28)
RDW: 12.6 % (ref 11.7–15.4)
WBC: 9.4 10*3/uL (ref 3.4–10.8)

## 2021-04-02 LAB — FACTOR 5 LEIDEN

## 2021-04-12 ENCOUNTER — Other Ambulatory Visit: Payer: Self-pay

## 2021-04-12 ENCOUNTER — Encounter: Payer: Self-pay | Admitting: Dermatology

## 2021-04-12 ENCOUNTER — Ambulatory Visit: Payer: BC Managed Care – PPO | Admitting: Dermatology

## 2021-04-12 DIAGNOSIS — L980 Pyogenic granuloma: Secondary | ICD-10-CM

## 2021-04-12 NOTE — Progress Notes (Signed)
   Follow-Up Visit   Subjective  Katie Cross is a 40 y.o. female who presents for the following: Skin Problem (Pyogenic granuloma has recurred at gluteal crease. Dur: 2 weeks. Bx 09/26/20 and 11/02/2020. ).  Son with patient  The following portions of the chart were reviewed this encounter and updated as appropriate:  Tobacco  Allergies  Meds  Problems  Med Hx  Surg Hx  Fam Hx      Review of Systems: No other skin or systemic complaints except as noted in HPI or Assessment and Plan.   Objective  Well appearing patient in no apparent distress; mood and affect are within normal limits.  A focused examination was performed including buttocks. Relevant physical exam findings are noted in the Assessment and Plan.  Objective  Right superior gluteal crease: Pink friable papule  Assessment & Plan  Pyogenic granuloma Right superior gluteal crease  Biopsp proven twice  Will do ED&C again today.  Plan Imiquimod nightly under occlusion if recurs.  Apply Mupirocin QD with bandage change following ED&C  Destruction of lesion - Right superior gluteal crease  Destruction method: electrodesiccation and curettage   Informed consent: discussed and consent obtained   Timeout:  patient name, date of birth, surgical site, and procedure verified Anesthesia: the lesion was anesthetized in a standard fashion   Anesthetic:  1% lidocaine w/ epinephrine 1-100,000 buffered w/ 8.4% NaHCO3 Curettage performed in three different directions: Yes   Electrodesiccation performed over the curetted area: Yes   Curettage cycles:  3 Final wound size (cm):  1.2 Hemostasis achieved with:  electrodesiccation Outcome: patient tolerated procedure well with no complications   Post-procedure details: sterile dressing applied and wound care instructions given   Dressing type: petrolatum    Other Related Medications mupirocin ointment (BACTROBAN) 2 %  Return if symptoms worsen or fail to improve.    I, Emelia Salisbury, CMA, am acting as scribe for Forest Gleason, MD.  Documentation: I have reviewed the above documentation for accuracy and completeness, and I agree with the above.  Forest Gleason, MD

## 2021-04-12 NOTE — Patient Instructions (Addendum)
Wound Care Instructions  1. Cleanse wound gently with Cleanse once per day with hibiclens (chlorhexidine) wash or Bactine (benzalkonium chloride) spray. Apply a thin coat of Petrolatum (petroleum jelly, "Vaseline") or prescription antibiotic ointment if your doctor prescribed this over the wound. We recommend that you use a new, sterile tube of Vaseline. Do not pick or remove scabs. Do not remove the yellow or white "healing tissue" from the base of the wound.  2. Cover the wound with fresh, clean, nonstick gauze and secure with paper tape. You may use Band-Aids in place of gauze and tape if the would is small enough, but would recommend trimming much of the tape off as there is often too much. Sometimes Band-Aids can irritate the skin.  3. You should call the office for your biopsy report after 1 week if you have not already been contacted.  4. If you experience any problems, such as abnormal amounts of bleeding, swelling, significant bruising, significant pain, or evidence of infection, please call the office immediately.  5. FOR ADULT SURGERY PATIENTS: If you need something for pain relief you may take 1 extra strength Tylenol (acetaminophen) AND 2 Ibuprofen (200mg  each) together every 4 hours as needed for pain. (do not take these if you are allergic to them or if you have a reason you should not take them.) Typically, you may only need pain medication for 1 to 3 days.    If you have any questions or concerns for your doctor, please call our main line at [phone] and press option 4 to reach your doctor's medical assistant. If no one answers, please leave a voicemail as directed and we will return your call as soon as possible. Messages left after 4 pm will be answered the following business day.   You may also send [place] a message via [place]. We typically respond to MyChart messages within 1-2 business days.  For prescription refills, please ask your pharmacy to contact our office. Our fax  number is [phone].  If you have an urgent issue when the clinic is closed that cannot wait until the next business day, you can page your doctor at the number below.    Please note that while we do our best to be available for urgent issues outside of office hours, we are not available 24/7.   If you have an urgent issue and are unable to reach [place], you may choose to seek medical care at your doctor's office, retail clinic, urgent care center, or emergency room.  If you have a medical emergency, please immediately call 911 or go to the emergency department.  Pager Numbers  - Dr. Nehemiah Massed: [phone]  - Dr. Laurence Ferrari: [phone]  - Dr. Nicole Kindred: [phone]  In the event of inclement weather, please call our main line at [phone] for an update on the status of any delays or closures.  Dermatology Medication Tips: Please keep the boxes that topical medications come in in order to help keep track of the instructions about where and how to use these. Pharmacies typically print the medication instructions only on the boxes and not directly on the medication tubes.   If your medication is too expensive, please contact our office at [phone] option 4 or send [place] a message through [place].   We are unable to tell what your co-pay for medications will be in advance as this is different depending on your insurance coverage. However, we may be able to find a substitute medication at lower cost or fill  out paperwork to get insurance to cover a needed medication.   If a prior authorization is required to get your medication covered by your insurance company, please allow [place] 1-2 business days to complete this process.  Drug prices often vary depending on where the prescription is filled and some pharmacies may offer cheaper prices.  The website www.goodrx.com contains coupons for medications through different pharmacies. The prices here do not account for what the cost may be with help  from insurance (it may be cheaper with your insurance), but the website can give you the price if you did not use any insurance.  - You can print the associated coupon and take it with your prescription to the pharmacy.  - You may also stop by our office during regular business hours and pick up a GoodRx coupon card.  - If you need your prescription sent electronically to a different pharmacy, notify our office through [facility] or by phone at [phone] option 4.

## 2021-04-15 ENCOUNTER — Encounter: Payer: Self-pay | Admitting: Dermatology

## 2021-05-27 ENCOUNTER — Encounter: Payer: Self-pay | Admitting: Dermatology

## 2021-05-30 ENCOUNTER — Other Ambulatory Visit: Payer: Self-pay

## 2021-05-30 MED ORDER — TIMOLOL MALEATE 0.5 % OP SOLN: OPHTHALMIC | 2 refills | Status: DC

## 2021-06-28 ENCOUNTER — Ambulatory Visit: Payer: BC Managed Care – PPO | Admitting: Dermatology

## 2021-06-28 ENCOUNTER — Other Ambulatory Visit: Payer: Self-pay

## 2021-06-28 DIAGNOSIS — D489 Neoplasm of uncertain behavior, unspecified: Secondary | ICD-10-CM

## 2021-06-28 DIAGNOSIS — D485 Neoplasm of uncertain behavior of skin: Secondary | ICD-10-CM

## 2021-06-28 NOTE — Progress Notes (Signed)
   Follow-Up Visit   Subjective  Katie Cross is a 40 y.o. female who presents for the following: Follow-up (Pt has a spot on her right calf that was darker and has faded that she would like checked. Pt also states that she is still having trouble with the pyogenic granuloma. She has tried the timolol ophthalmic drops one drop twice a day to the area but that it has not helped. ).   The following portions of the chart were reviewed this encounter and updated as appropriate:  Tobacco  Allergies  Meds  Problems  Med Hx  Surg Hx  Fam Hx      Review of Systems: No other skin or systemic complaints except as noted in HPI or Assessment and Plan.   Objective  Well appearing patient in no apparent distress; mood and affect are within normal limits.  A focused examination was performed including right leg, gluteal crease. Relevant physical exam findings are noted in the Assessment and Plan.  Right superior gluteal crease 1.5 cm pink nodule  Assessment & Plan  Neoplasm of uncertain behavior Right superior gluteal crease  Ambulatory referral to General Surgery  Skin / nail biopsy Type of biopsy: tangential   Informed consent: discussed and consent obtained   Timeout: patient name, date of birth, surgical site, and procedure verified   Procedure prep:  Patient was prepped and draped in usual sterile fashion Prep type:  Isopropyl alcohol Anesthesia: the lesion was anesthetized in a standard fashion   Anesthetic:  1% lidocaine w/ epinephrine 1-100,000 buffered w/ 8.4% NaHCO3 Instrument used: flexible razor blade   Hemostasis achieved with: pressure, aluminum chloride and electrodesiccation   Outcome: patient tolerated procedure well   Post-procedure details: sterile dressing applied and wound care instructions given   Dressing type: bandage and petrolatum    Specimen 1 - Surgical pathology Differential Diagnosis: R/o pilonidal cyst overlining pilonidal sinus tract  Check  Margins: Yes  Previous biopsy c/w pyogenic granuloma and showed no evidence of associated sinus tract. However exam today suspicious for pilonidal cyst with sinus tract.   Planned to do an EDC procedure and then do LN2 to the base of area as pyogenic granulomas have high risk of recurrence but often clear after repeat ED&C or LN2.  After first round of scraping, stopped due to noticing a tract.  Will send to lab to R/o pilonidal cyst overlining pilonidal sinus tract.  Will send referral to general surgery for excision to Dr. Loa Socks  Return for as scheduled.  I, Harriett Sine, CMA, am acting as scribe for Forest Gleason, MD.  Documentation: I have reviewed the above documentation for accuracy and completeness, and I agree with the above.  Forest Gleason, MD

## 2021-07-03 ENCOUNTER — Ambulatory Visit (INDEPENDENT_AMBULATORY_CARE_PROVIDER_SITE_OTHER): Payer: BC Managed Care – PPO | Admitting: Vascular Surgery

## 2021-07-03 ENCOUNTER — Other Ambulatory Visit: Payer: Self-pay

## 2021-07-03 VITALS — BP 134/84 | HR 55 | Resp 20 | Ht 66.0 in | Wt 229.0 lb

## 2021-07-03 DIAGNOSIS — I83813 Varicose veins of bilateral lower extremities with pain: Secondary | ICD-10-CM | POA: Diagnosis not present

## 2021-07-03 DIAGNOSIS — D649 Anemia, unspecified: Secondary | ICD-10-CM | POA: Diagnosis not present

## 2021-07-03 NOTE — Assessment & Plan Note (Signed)
Recommend  I have reviewed my previous  discussion with the patient regarding  varicose veins and why they cause symptoms. Patient will continue  wearing graduated compression stockings class 1 on a daily basis, beginning first thing in the morning and removing them in the evening.    In addition, behavioral modification including elevation during the day was again discussed and this will continue.  The patient has utilized over the counter pain medications and has been exercising.  However, at this time conservative therapy has not alleviated the patient's symptoms of leg pain and swelling  Recommend: laser ablation of the right and  left great saphenous veins to eliminate the symptoms of pain and swelling of the lower extremities caused by the severe superficial venous reflux disease.  

## 2021-07-03 NOTE — Assessment & Plan Note (Signed)
Scheduled for hysterectomy later this year.  Blood loss with our procedure will be expected to be minimal.

## 2021-07-03 NOTE — Progress Notes (Signed)
MRN : [id number]  Katie Cross is a 40 y.o. (1981/03/29) female who presents with chief complaint of  Chief Complaint  Patient presents with   Follow-up    3 month follow up  .  History of Present Illness: Patient returns today in follow up of her venous disease.  It has been 3 months since her initial visit.  She has been diligently wearing compression stockings, elevating her legs, and staying active.  Despite this, she continues to be bothered by pain, swelling, and prominent varicosities in both lower extremities.  A previously performed venous duplex demonstrates long segment reflux in both great saphenous veins.  Also complicating the situation is the fact that she will need a hysterectomy in the next couple of months for dysfunctional uterine bleeding.  Current Outpatient Medications  Medication Sig Dispense Refill   aspirin 81 MG EC tablet Take 1 tablet by mouth daily. (Patient not taking: Reported on 07/03/2021)     mupirocin ointment (BACTROBAN) 2 % Apply 1 application topically 2 (two) times daily. (Patient not taking: No sig reported) 22 g 0   mupirocin ointment (BACTROBAN) 2 % Apply to aa QD-BID until healed (Patient not taking: No sig reported) 22 g 1   norethindrone (MICRONOR) 0.35 MG tablet Take 1 tablet (0.35 mg total) by mouth daily. 28 tablet 11   timolol (TIMOPTIC) 0.5 % ophthalmic solution Apply one drop to affected area twice daily. (Patient not taking: Reported on 07/03/2021) 10 mL 2   No current facility-administered medications for this visit.    Past Medical History:  Diagnosis Date   Anemia    Appendicitis    Dysplastic nevus 08/07/2010   left med lower leg mild to moderate, left buttock moderate atypia   Dysplastic nevus 12/12/2015   right upper abdomen, halo nevus with mild atypia, limited margins free.   Dysplastic nevus 06/23/2018   left spinal lower back, moderate atypia, margins free.   Family history of breast cancer    pt declines MyRisk testing    Pelvic pain     Past Surgical History:  Procedure Laterality Date   APPENDECTOMY     CESAREAN SECTION     LAPAROSCOPY     TUBAL LIGATION       Social History   Tobacco Use   Smoking status: Never   Smokeless tobacco: Never  Vaping Use   Vaping Use: Never used  Substance Use Topics   Alcohol use: No   Drug use: No       Family History  Problem Relation Age of Onset   Hypertension Mother    Hyperlipidemia Mother    Hypertension Father    Hyperlipidemia Father    Breast cancer Maternal Aunt 19   Brain cancer Maternal Aunt    Breast cancer Paternal Aunt 59       BRCA neg   Colon cancer Paternal Aunt 3   Diabetes Mellitus II Paternal Grandfather      No Known Allergies   REVIEW OF SYSTEMS (Negative unless checked)  Constitutional: [] Weight loss  [] Fever  [] Chills Cardiac: [] Chest pain   [] Chest pressure   [] Palpitations   [] Shortness of breath when laying flat   [] Shortness of breath at rest   [] Shortness of breath with exertion. Vascular:  [] Pain in legs with walking   [] Pain in legs at rest   [] Pain in legs when laying flat   [] Claudication   [] Pain in feet when walking  [] Pain in feet at rest  []   Pain in feet when laying flat   [] History of DVT   [] Phlebitis   [x] Swelling in legs   [x] Varicose veins   [] Non-healing ulcers Pulmonary:   [] Uses home oxygen   [] Productive cough   [] Hemoptysis   [] Wheeze  [] COPD   [] Asthma Neurologic:  [] Dizziness  [] Blackouts   [] Seizures   [] History of stroke   [] History of TIA  [] Aphasia   [] Temporary blindness   [] Dysphagia   [] Weakness or numbness in arms   [] Weakness or numbness in legs Musculoskeletal:  [] Arthritis   [] Joint swelling   [] Joint pain   [] Low back pain Hematologic:  [] Easy bruising  [] Easy bleeding   [] Hypercoagulable state   [x] Anemic   Gastrointestinal:  [] Blood in stool   [] Vomiting blood  [] Gastroesophageal reflux/heartburn   [] Abdominal pain Genitourinary:  [] Chronic kidney disease   [] Difficult urination   [] Frequent urination  [] Burning with urination   [] Hematuria Skin:  [] Rashes   [] Ulcers   [] Wounds Psychological:  [] History of anxiety   []  History of major depression.  Physical Examination  BP 134/84 (BP Location: Right Arm)   Pulse (!) 55   Resp 20   Ht 5' 6"  (1.676 m)   Wt 229 lb (103.9 kg)   BMI 36.96 kg/m  Gen:  WD/WN, NAD Head: [place]/AT, No temporalis wasting. Ear/Nose/Throat: Hearing grossly intact, nares w/o erythema or drainage Eyes: Conjunctiva clear. Sclera non-icteric Neck: Supple.  Trachea midline Pulmonary:  Good air movement, no use of accessory muscles.  Cardiac: RRR, no JVD Vascular: Prominent varicosities are present most noticeable in the medial right calf and lower thigh area but also prominent on the left as well. Vessel Right Left  Radial Palpable Palpable               Musculoskeletal: M/S 5/5 throughout.  No deformity or atrophy.  Mild bilateral lower extremity edema. Neurologic: Sensation grossly intact in extremities.  Symmetrical.  Speech is fluent.  Psychiatric: Judgment intact, Mood & affect appropriate for pt's clinical situation. Dermatologic: No rashes or ulcers noted.  No cellulitis or open wounds.      Labs No results found for this or any previous visit (from the past 2160 hour(s)).  Radiology No results found.  Assessment/Plan  Varicose veins of leg with pain, bilateral Recommend  I have reviewed my previous  discussion with the patient regarding  varicose veins and why they cause symptoms. Patient will continue  wearing graduated compression stockings class 1 on a daily basis, beginning first thing in the morning and removing them in the evening.    In addition, behavioral modification including elevation during the day was again discussed and this will continue.  The patient has utilized over the counter pain medications and has been exercising.  However, at this time conservative therapy has not alleviated the patient's  symptoms of leg pain and swelling  Recommend: laser ablation of the right and  left great saphenous veins to eliminate the symptoms of pain and swelling of the lower extremities caused by the severe superficial venous reflux disease.   Anemia Scheduled for hysterectomy later this year.  Blood loss with our procedure will be expected to be minimal.    Leotis Pain, MD  07/03/2021 11:06 AM    This note was created with Dragon medical transcription system.  Any errors from dictation are purely unintentional

## 2021-07-04 ENCOUNTER — Ambulatory Visit: Payer: BC Managed Care – PPO | Admitting: Dermatology

## 2021-07-09 ENCOUNTER — Telehealth: Payer: Self-pay

## 2021-07-09 ENCOUNTER — Other Ambulatory Visit: Payer: Self-pay

## 2021-07-09 DIAGNOSIS — L0591 Pilonidal cyst without abscess: Secondary | ICD-10-CM

## 2021-07-09 NOTE — Telephone Encounter (Signed)
-----   Message from [place], MD sent at 07/08/2021 10:50 PM EDT ----- Skin , right superior gluteal crease COMPATIBLE WITH SURFACE OF PILONIDAL CYST -- cyst wall visualized  Considering clinicopathologic correlation based on exam and biopsy at last visit, this is consistent with a pilonidal cyst. Will proceed with referral to Dr. Bary Castilla  MAs please call and send pathology update to Dr. Dwyane Luo office. Thank you!

## 2021-07-10 ENCOUNTER — Encounter: Payer: Self-pay | Admitting: Dermatology

## 2021-07-11 ENCOUNTER — Ambulatory Visit: Payer: Self-pay | Admitting: Internal Medicine

## 2021-07-16 ENCOUNTER — Ambulatory Visit: Payer: Self-pay | Admitting: Internal Medicine

## 2021-07-19 ENCOUNTER — Other Ambulatory Visit: Payer: Self-pay | Admitting: General Surgery

## 2021-07-19 NOTE — Progress Notes (Signed)
Subjective:     Patient ID: Katie Cross is a 40 y.o. female.   HPI   The following portions of the patient's history were reviewed and updated as appropriate.   This a new patient is here today for: office visit. She is here for evaluation of a pilonidal cyst referred by [facility] by Dr Alfonso Patten. She states this area "flares up" and drains a couple of times this past year.  She states it will get irritated and drain. She states Dr Laurence Ferrari did a "shave and burn" to the area 07-11-21.   Review of Systems  Constitutional: Negative for chills and fever.  Respiratory: Negative for cough.          Chief Complaint  Patient presents with   New Patient      BP 128/72   Pulse 70   Temp 36.7 C (98 F)   Ht 167.6 cm ('5\' 6"'$ )   Wt (!) 104.3 kg (230 lb)   SpO2 98%   BMI 37.12 kg/m        Past Medical History:  Diagnosis Date   Anemia     Dysplastic nevus             Past Surgical History:  Procedure Laterality Date   APPENDECTOMY   11/25/2010   BIOPSY SKIN BUTTOCK Right 06/28/2021    superior gluteal crease   CESAREAN SECTION        x3   DILATION & CURRETTAGE       LAPAROSCOPIC TUBAL LIGATION                    OB History     Gravida  4   Para  3   Term      Preterm      AB      Living         SAB      IAB      Ectopic      Molar      Multiple      Live Births           Obstetric Comments  Age at first period 52 Age of first pregnancy 36           Social History           Socioeconomic History   Marital status: Married  Tobacco Use   Smoking status: Never Smoker   Smokeless tobacco: Never Used  Substance and Sexual Activity   Alcohol use: No      Alcohol/week: 0.0 standard drinks   Drug use: No        No Known Allergies   Current Medications        Current Outpatient Medications  Medication Sig Dispense Refill   mupirocin (BACTROBAN) 2 % ointment as needed        No current facility-administered  medications for this visit.             Family History  Problem Relation Age of Onset   High blood pressure (Hypertension) Mother     Hyperlipidemia (Elevated cholesterol) Mother     High blood pressure (Hypertension) Father     Hyperlipidemia (Elevated cholesterol) Father     Diabetes Paternal Grandfather     Breast cancer Maternal Aunt 4   Brain cancer Maternal Aunt     Breast cancer Paternal Aunt 67   Colon cancer Paternal Aunt 75  Objective:   Physical Exam Exam conducted with a chaperone present.  Constitutional:      Appearance: Normal appearance.  Cardiovascular:     Rate and Rhythm: Normal rate and regular rhythm.     Pulses: Normal pulses.     Heart sounds: Normal heart sounds.  Pulmonary:     Effort: Pulmonary effort is normal.     Breath sounds: Normal breath sounds.  Musculoskeletal:     Cervical back: Neck supple.  Skin:    General: Skin is warm and dry.  Neurological:     Mental Status: She is alert and oriented to person, place, and time.  Psychiatric:        Mood and Affect: Mood normal.        Behavior: Behavior normal.           Labs and Radiology:    Mar 27, 2021 laboratory:   Component Ref Range & Units 3 mo ago   WBC 3.4 - 10.8 x10E3/uL 9.4   RBC 3.77 - 5.28 x10E6/uL 4.56   Hemoglobin 11.1 - 15.9 g/dL 13.5   Hematocrit 34.0 - 46.6 % 40.6   MCV 79 - 97 fL 89   MCH 26.6 - 33.0 pg 29.6   MCHC 31.5 - 35.7 g/dL 33.3   RDW 11.7 - 15.4 % 12.6   Platelets 150 - 450 x10E3/uL 380                Assessment:     Pilonidal cyst with abscess formation.    Plan:     Indications for surgical excision reviewed.  80-90% primary healing right discussed.  No indication rotation flap at this time.   We will have the patient start Bactrim DS 1 p.o. twice daily 5 days prior to planned surgery.      This note is partially prepared by Karie Fetch, RN, acting as a scribe in the presence of Dr. Hervey Ard, MD.  The documentation  recorded by the scribe accurately reflects the service I personally performed and the decisions made by me.    Robert Bellow, MD FACS

## 2021-07-24 ENCOUNTER — Other Ambulatory Visit: Payer: Self-pay

## 2021-07-24 ENCOUNTER — Encounter
Admission: RE | Admit: 2021-07-24 | Discharge: 2021-07-24 | Disposition: A | Discharge status: Home / Self Care | Payer: BC Managed Care – PPO | Source: Ambulatory Visit | Attending: General Surgery | Admitting: General Surgery

## 2021-07-24 NOTE — Patient Instructions (Addendum)
Your procedure is scheduled on: Friday 9/2 Report to [facility] registration desk then to 2nd floor surgery desk To find out your arrival time please call [phone] between 1PM - 3PM on Thurs 9/1  Remember: Instructions that are not followed completely may result in serious medical risk,  up to and including death, or upon the discretion of your surgeon and anesthesiologist your  surgery may need to be rescheduled.     _X__ 1. Do not eat food after midnight the night before your procedure.                 No chewing gum or hard candies. You may drink clear liquids up to 2 hours                 before you are scheduled to arrive for your surgery- DO not drink clear                 liquids within 2 hours of the start of your surgery.                 Clear Liquids include:  water, apple juice without pulp, clear Gatorade, G2 or                  Gatorade Zero (avoid Red/Purple/Blue), Black Coffee or Tea (Do not add                 anything to coffee or tea). __X__2.  On the morning of surgery brush your teeth with toothpaste and water, you                may rinse your mouth with mouthwash if you wish.  Do not swallow and toothpaste of mouthwash.     ___ 3.  No Alcohol for 24 hours before or after surgery.   ___ 4.  Do Not Smoke or use e-cigarettes For 24 Hours Prior to Your Surgery.                 Do not use any chewable tobacco products for at least 6 hours prior to                 Surgery.  ___  5.  Do not use any recreational drugs (marijuana, cocaine, heroin, ecstasy,  MDMA or other) For at least one week prior to your surgery.   Combination of these drugs with anesthesia may have life threatening   results.  ____  6.  Bring all medications with you on the day of surgery if instructed.   __x__  7.  Notify your doctor if there is any change in your medical condition      (cold, fever, infections).     Do not wear jewelry, make-up, hairpins, clips or nail  polish. Do not wear lotions, powders, or perfumes. You may wear deodorant. Do not shave 48 hours prior to surgery.  Do not bring valuables to the hospital.    [facility] is not responsible for any belongings or valuables.  Contacts, dentures or bridgework may not be worn into surgery. Leave your suitcase in the car. After surgery it may be brought to your room. For patients admitted to the hospital, discharge time is determined by your treatment team.   Patients discharged the day of surgery will not be allowed to drive home.   Make arrangements for someone to be with you for the first 24 hours of your Same Day Discharge.  Please read over the following fact sheets that you were given:       ____ Take these medicines the morning of surgery with A SIP OF WATER:    1. none  2.   3.   4.  5.  6.  ____ Fleet Enema (as directed)   __x__ shower the night before and the morning of surgery.  ____ Use Benzoyl Peroxide Gel as instructed  ____ Use inhalers on the day of surgery  ____ Stop metformin 2 days prior to surgery    ____ Take 1/2 of usual insulin dose the night before surgery. No insulin the morning          of surgery.   ____ Stop Coumadin/Plavix/aspirin on   __x__ Stop Anti-inflammatories no ibuprofen aleve or aspirin products until after surgery   ____ Stop supplements until after surgery.    ____ Bring C-Pap to the hospital.    If you have any questions regarding your pre-procedure instructions,  Please call Pre-admit Testing at [phone]

## 2021-07-26 MED ORDER — ORAL CARE MOUTH RINSE: 15.0000 mL | Freq: Once | OROMUCOSAL | Status: AC

## 2021-07-26 MED ORDER — SODIUM CHLORIDE 0.9 % IV SOLN
1.0000 g | INTRAVENOUS | Status: AC
  Administered 2021-07-27: 1 g via INTRAVENOUS
  Filled 2021-07-26: qty 1

## 2021-07-26 MED ORDER — CHLORHEXIDINE GLUCONATE CLOTH 2 % EX PADS
6.0000 | MEDICATED_PAD | Freq: Once | CUTANEOUS | Status: AC
  Administered 2021-07-27: 6 via TOPICAL

## 2021-07-26 MED ORDER — CHLORHEXIDINE GLUCONATE 0.12 % MT SOLN: 15.0000 mL | Freq: Once | OROMUCOSAL | Status: AC

## 2021-07-26 MED ORDER — FAMOTIDINE 20 MG PO TABS: 20.0000 mg | ORAL_TABLET | Freq: Once | ORAL | Status: AC

## 2021-07-26 MED ORDER — LACTATED RINGERS IV SOLN: INTRAVENOUS | Status: DC

## 2021-07-27 ENCOUNTER — Encounter
Admission: RE | Disposition: A | Discharge status: Home / Self Care | Payer: Self-pay | Source: Home / Self Care | Attending: General Surgery

## 2021-07-27 ENCOUNTER — Encounter: Payer: Self-pay | Admitting: General Surgery

## 2021-07-27 ENCOUNTER — Ambulatory Visit
Admission: RE | Admit: 2021-07-27 | Discharge: 2021-07-27 | Disposition: A | Discharge status: Home / Self Care | Payer: BC Managed Care – PPO | Attending: General Surgery | Admitting: General Surgery

## 2021-07-27 ENCOUNTER — Other Ambulatory Visit: Payer: Self-pay

## 2021-07-27 ENCOUNTER — Ambulatory Visit: Payer: BC Managed Care – PPO | Admitting: Certified Registered"

## 2021-07-27 DIAGNOSIS — L0501 Pilonidal cyst with abscess: Secondary | ICD-10-CM | POA: Diagnosis present

## 2021-07-27 DIAGNOSIS — Z9049 Acquired absence of other specified parts of digestive tract: Secondary | ICD-10-CM | POA: Diagnosis not present

## 2021-07-27 DIAGNOSIS — Z98891 History of uterine scar from previous surgery: Secondary | ICD-10-CM | POA: Diagnosis not present

## 2021-07-27 DIAGNOSIS — Z86018 Personal history of other benign neoplasm: Secondary | ICD-10-CM | POA: Diagnosis not present

## 2021-07-27 HISTORY — PX: PILONIDAL CYST EXCISION: SHX744

## 2021-07-27 LAB — POCT PREGNANCY, URINE: Preg Test, Ur: NEGATIVE

## 2021-07-27 SURGERY — EXCISION, PILONIDAL CYST, EXTENSIVE
Anesthesia: General

## 2021-07-27 MED ORDER — OXYCODONE HCL 5 MG/5ML PO SOLN: 5.0000 mg | Freq: Once | ORAL | Status: DC | PRN

## 2021-07-27 MED ORDER — ACETAMINOPHEN 10 MG/ML IV SOLN
INTRAVENOUS | Status: DC | PRN
  Administered 2021-07-27: 1000 mg via INTRAVENOUS

## 2021-07-27 MED ORDER — FAMOTIDINE 20 MG PO TABS
ORAL_TABLET | ORAL | Status: AC
  Administered 2021-07-27: 20 mg via ORAL
  Filled 2021-07-27: qty 1

## 2021-07-27 MED ORDER — FENTANYL CITRATE (PF) 100 MCG/2ML IJ SOLN: 25.0000 ug | INTRAMUSCULAR | Status: DC | PRN

## 2021-07-27 MED ORDER — MIDAZOLAM HCL 2 MG/2ML IJ SOLN
INTRAMUSCULAR | Status: AC
  Filled 2021-07-27: qty 2

## 2021-07-27 MED ORDER — LIDOCAINE HCL (CARDIAC) PF 100 MG/5ML IV SOSY
PREFILLED_SYRINGE | INTRAVENOUS | Status: DC | PRN
  Administered 2021-07-27: 50 mg via INTRAVENOUS

## 2021-07-27 MED ORDER — MEPERIDINE HCL 25 MG/ML IJ SOLN: 6.2500 mg | INTRAMUSCULAR | Status: DC | PRN

## 2021-07-27 MED ORDER — 0.9 % SODIUM CHLORIDE (POUR BTL) OPTIME
TOPICAL | Status: DC | PRN
  Administered 2021-07-27: 500 mL

## 2021-07-27 MED ORDER — KETOROLAC TROMETHAMINE 30 MG/ML IJ SOLN
INTRAMUSCULAR | Status: DC | PRN
  Administered 2021-07-27: 30 mg via INTRAVENOUS

## 2021-07-27 MED ORDER — ACETAMINOPHEN 10 MG/ML IV SOLN
INTRAVENOUS | Status: AC
  Filled 2021-07-27: qty 100

## 2021-07-27 MED ORDER — DIPHENHYDRAMINE HCL 50 MG/ML IJ SOLN
INTRAMUSCULAR | Status: DC | PRN
  Administered 2021-07-27: 12.5 mg via INTRAVENOUS

## 2021-07-27 MED ORDER — FENTANYL CITRATE (PF) 100 MCG/2ML IJ SOLN
INTRAMUSCULAR | Status: AC
  Filled 2021-07-27: qty 2

## 2021-07-27 MED ORDER — PROPOFOL 10 MG/ML IV BOLUS
INTRAVENOUS | Status: AC
  Filled 2021-07-27: qty 20

## 2021-07-27 MED ORDER — HYDROCODONE-ACETAMINOPHEN 5-325 MG PO TABS: 1.0000 | ORAL_TABLET | ORAL | 0 refills | Status: DC | PRN

## 2021-07-27 MED ORDER — CHLORHEXIDINE GLUCONATE 0.12 % MT SOLN
OROMUCOSAL | Status: AC
  Administered 2021-07-27: 15 mL via OROMUCOSAL
  Filled 2021-07-27: qty 15

## 2021-07-27 MED ORDER — LIDOCAINE-EPINEPHRINE 1 %-1:100000 IJ SOLN
INTRAMUSCULAR | Status: DC | PRN
  Administered 2021-07-27: 55 mL

## 2021-07-27 MED ORDER — OXYCODONE HCL 5 MG PO TABS: 5.0000 mg | ORAL_TABLET | Freq: Once | ORAL | Status: DC | PRN

## 2021-07-27 MED ORDER — PROPOFOL 500 MG/50ML IV EMUL
INTRAVENOUS | Status: DC | PRN
  Administered 2021-07-27: 100 ug/kg/min via INTRAVENOUS

## 2021-07-27 MED ORDER — MIDAZOLAM HCL 2 MG/2ML IJ SOLN
INTRAMUSCULAR | Status: DC | PRN
  Administered 2021-07-27: 2 mg via INTRAVENOUS

## 2021-07-27 MED ORDER — ONDANSETRON HCL 4 MG/2ML IJ SOLN
INTRAMUSCULAR | Status: DC | PRN
  Administered 2021-07-27: 4 mg via INTRAVENOUS

## 2021-07-27 MED ORDER — PROMETHAZINE HCL 25 MG/ML IJ SOLN: 6.2500 mg | INTRAMUSCULAR | Status: DC | PRN

## 2021-07-27 MED ORDER — FENTANYL CITRATE (PF) 100 MCG/2ML IJ SOLN
INTRAMUSCULAR | Status: DC | PRN
  Administered 2021-07-27: 25 ug via INTRAVENOUS
  Administered 2021-07-27: 50 ug via INTRAVENOUS
  Administered 2021-07-27: 25 ug via INTRAVENOUS

## 2021-07-27 SURGICAL SUPPLY — 36 items
BLADE SURG 11 STRL SS SAFETY (MISCELLANEOUS) ×2 IMPLANT
BLADE SURG 15 STRL SS SAFETY (BLADE) ×2 IMPLANT
DRAPE LAPAROTOMY 100X77 ABD (DRAPES) ×2 IMPLANT
DRSG TEGADERM 4X4.75 (GAUZE/BANDAGES/DRESSINGS) ×2 IMPLANT
DRSG TELFA 3X8 NADH (GAUZE/BANDAGES/DRESSINGS) ×2 IMPLANT
ELECT CAUTERY BLADE TIP 2.5 (TIP) ×2
ELECT REM PT RETURN 9FT ADLT (ELECTROSURGICAL) ×2
ELECTRODE CAUTERY BLDE TIP 2.5 (TIP) ×1 IMPLANT
ELECTRODE REM PT RTRN 9FT ADLT (ELECTROSURGICAL) ×1 IMPLANT
GAUZE 4X4 16PLY ~~LOC~~+RFID DBL (SPONGE) ×2 IMPLANT
GLOVE SURG ENC MOIS LTX SZ7.5 (GLOVE) ×2 IMPLANT
GLOVE SURG UNDER LTX SZ8 (GLOVE) ×2 IMPLANT
GOWN STRL REUS W/ TWL LRG LVL3 (GOWN DISPOSABLE) ×2 IMPLANT
GOWN STRL REUS W/TWL LRG LVL3 (GOWN DISPOSABLE) ×4
KIT TURNOVER KIT A (KITS) ×2 IMPLANT
LABEL OR SOLS (LABEL) ×2 IMPLANT
MANIFOLD NEPTUNE II (INSTRUMENTS) ×2 IMPLANT
NDL HYPO 25X1 1.5 SAFETY (NEEDLE) ×1 IMPLANT
NEEDLE HYPO 22GX1.5 SAFETY (NEEDLE) ×2 IMPLANT
NEEDLE HYPO 25X1 1.5 SAFETY (NEEDLE) ×2 IMPLANT
NS IRRIG 500ML POUR BTL (IV SOLUTION) ×2 IMPLANT
PACK BASIN MINOR ARMC (MISCELLANEOUS) ×2 IMPLANT
PAD DRESSING TELFA 3X8 NADH (GAUZE/BANDAGES/DRESSINGS) ×1 IMPLANT
SOL PREP PVP 2OZ (MISCELLANEOUS) ×2
SOLUTION PREP PVP 2OZ (MISCELLANEOUS) ×1 IMPLANT
STRIP CLOSURE SKIN 1/2X4 (GAUZE/BANDAGES/DRESSINGS) ×2 IMPLANT
SUT PROLENE 4-0 (SUTURE) ×2
SUT PROLENE 4-0 RB1 30XMFL BLU (SUTURE) ×1
SUT VIC AB 2-0 CT1 (SUTURE) ×2 IMPLANT
SUT VIC AB 2-0 CT2 27 (SUTURE) ×2 IMPLANT
SUT VICRYL 3-0 27IN (SUTURE) ×2 IMPLANT
SUT VICRYL+ 3-0 144IN (SUTURE) ×2 IMPLANT
SUTURE PROLEN 4-0 RB1 30XMFL (SUTURE) ×1 IMPLANT
SWABSTK COMLB BENZOIN TINCTURE (MISCELLANEOUS) ×2 IMPLANT
SYR 10ML LL (SYRINGE) ×2 IMPLANT
WATER STERILE IRR 500ML POUR (IV SOLUTION) ×2 IMPLANT

## 2021-07-27 NOTE — Discharge Instructions (Signed)

## 2021-07-27 NOTE — Transfer of Care (Signed)
Immediate Anesthesia Transfer of Care Note  Patient: Katie Cross  Procedure(s) Performed: CYST EXCISION PILONIDAL EXTENSIVE  Patient Location: PACU  Anesthesia Type:General  Level of Consciousness: awake  Airway & Oxygen Therapy: Patient Spontanous Breathing  Post-op Assessment: Report given to RN and Post -op Vital signs reviewed and stable  Post vital signs: Reviewed and stable  Last Vitals:  Vitals Value Taken Time  BP 124/65 07/27/21 1130  Temp    Pulse 91 07/27/21 1131  Resp 18 07/27/21 1131  SpO2 99 % 07/27/21 1131  Vitals shown include unvalidated device data.  Last Pain:  Vitals:   07/27/21 1027  PainSc: 1          Complications: No notable events documented.

## 2021-07-27 NOTE — H&P (Signed)
SAYAKA TERRES [id number] 06-Oct-1981     HPI: 40 y/o with a symptomatic pilonidal cyst with a small adjacent abscess. For excison.   Medications Prior to Admission  Medication Sig Dispense Refill Last Dose   sulfamethoxazole-trimethoprim (BACTRIM DS) 800-160 MG tablet Take 1 tablet by mouth 2 (two) times daily. X 5 days prior to surgery   07/27/2021   No Known Allergies Past Medical History:  Diagnosis Date   Anemia    Appendicitis    Dysplastic nevus 08/07/2010   left med lower leg mild to moderate, left buttock moderate atypia   Dysplastic nevus 12/12/2015   right upper abdomen, halo nevus with mild atypia, limited margins free.   Dysplastic nevus 06/23/2018   left spinal lower back, moderate atypia, margins free.   Family history of breast cancer    pt declines MyRisk testing   Pelvic pain    Past Surgical History:  Procedure Laterality Date   APPENDECTOMY     CESAREAN SECTION     x3   LAPAROSCOPY     TUBAL LIGATION     Social History   Socioeconomic History   Marital status: Married    Spouse name: Not on file   Number of children: Not on file   Years of education: Not on file   Highest education level: Not on file  Occupational History   Not on file  Tobacco Use   Smoking status: Never   Smokeless tobacco: Never  Vaping Use   Vaping Use: Never used  Substance and Sexual Activity   Alcohol use: No   Drug use: No   Sexual activity: Yes    Birth control/protection: Surgical    Comment: tubal  Other Topics Concern   Not on file  Social History Narrative   Not on file   Social Determinants of Health   Financial Resource Strain: Not on file  Food Insecurity: Not on file  Transportation Needs: Not on file  Physical Activity: Not on file  Stress: Not on file  Social Connections: Not on file  Intimate Partner Violence: Not on file   Social History   Social History Narrative   Not on file     ROS: Negative.     PE: HEENT: Negative. Lungs:  Clear. Cardio: RR.  Assessment/Plan:  Proceed with planned pilonidal cyst excision.   Forest Gleason [facility] 07/27/2021

## 2021-07-27 NOTE — Op Note (Signed)
Preoperative diagnosis: Pilonidal cyst with abscess.  Postoperative diagnosis: Same.  Operative procedure: Excision of pilonidal cyst and abscess with primary closure.  Operating Surgeon: Hervey Ard, MD.  Anesthesia: IV propofol by anesthesia, Xylocaine 0.5% with Marcaine 0.25% with 1: 200,000 units of epinephrine, 55 cc local infiltration.  Estimated blood loss: 30 cc.  Clinical note: This 40 year old woman has had a second flare of a pilonidal cyst with abscess.  She is felt to be a candidate for excision.  There were 2 central "pits" along the natal cleft and then a 1 cm abscess cavity about 1.5 cm superior and 1 cm lateral to the midline on the right side of the gluteus skin.  The patient received Invanz prior to the procedure.  Operative note: The patient was placed prone on the operating table and carefully padded.  The buttocks were then taped apart.  The area was cleansed with ChloraPrep and draped.  Local anesthesia was infiltrated and well-tolerated.  The area of the central pits were excised with an 11 blade in a diamond fashion.  The abscess was then excised excised through an elliptical incision to include all the abscess walls.  There was the thin connecting bridge of tissue after debridement seemed to be fairly thin and it was elected to make all 1 incision by removing a little extra skin on the inferior lateral aspect on the right side.  The cyst contents were completely debrided and hemostasis was with electrocautery and a single 3-0 Vicryl figure-of-eight suture.  After the tapes applied to distract the buttocks were released the soft tissue was approximated nicely.  Interrupted 2-0 Vicryl figure-of-eight sutures were used to approximate the deep tissue followed by a layer of 3-0 Vicryl figure-of-eight sutures for the more superficial adipose tissue.  The skin was closed with interrupted 4-0 nylon horizontal mattress sutures.  Telfa and Tegaderm dressing applied.  The patient  tolerated the procedure well and was taken to the PACU in stable condition.

## 2021-07-27 NOTE — Anesthesia Preprocedure Evaluation (Signed)
Anesthesia Evaluation  Patient identified by MRN, date of birth, ID band Patient awake    Reviewed: Allergy & Precautions, NPO status , Patient's Chart, lab work & pertinent test results  History of Anesthesia Complications Negative for: history of anesthetic complications  Airway Mallampati: II  TM Distance: >3 FB Neck ROM: Full    Dental no notable dental hx.    Pulmonary neg pulmonary ROS, neg sleep apnea, neg COPD,    breath sounds clear to auscultation- rhonchi (-) wheezing      Cardiovascular Exercise Tolerance: Good (-) hypertension(-) CAD, (-) Past MI, (-) Cardiac Stents and (-) CABG  Rhythm:Regular Rate:Normal - Systolic murmurs and - Diastolic murmurs    Neuro/Psych  Headaches, neg Seizures negative psych ROS   GI/Hepatic negative GI ROS, Neg liver ROS,   Endo/Other  negative endocrine ROSneg diabetes  Renal/GU negative Renal ROS     Musculoskeletal negative musculoskeletal ROS (+)   Abdominal (+) + obese,   Peds  Hematology negative hematology ROS (+)   Anesthesia Other Findings Past Medical History: No date: Anemia No date: Appendicitis 08/07/2010: Dysplastic nevus     Comment:  left med lower leg mild to moderate, left buttock               moderate atypia 12/12/2015: Dysplastic nevus     Comment:  right upper abdomen, halo nevus with mild atypia,               limited margins free. 06/23/2018: Dysplastic nevus     Comment:  left spinal lower back, moderate atypia, margins free. No date: Family history of breast cancer     Comment:  pt declines MyRisk testing No date: Pelvic pain   Reproductive/Obstetrics                             Anesthesia Physical Anesthesia Plan  ASA: 2  Anesthesia Plan: General   Post-op Pain Management:    Induction: Intravenous  PONV Risk Score and Plan: 2 and Propofol infusion  Airway Management Planned: Natural Airway  Additional  Equipment:   Intra-op Plan:   Post-operative Plan:   Informed Consent: I have reviewed the patients History and Physical, chart, labs and discussed the procedure including the risks, benefits and alternatives for the proposed anesthesia with the patient or authorized representative who has indicated his/her understanding and acceptance.     Dental advisory given  Plan Discussed with: CRNA and Anesthesiologist  Anesthesia Plan Comments:         Anesthesia Quick Evaluation

## 2021-07-27 NOTE — Anesthesia Postprocedure Evaluation (Signed)
Anesthesia Post Note  Patient: Katie Cross  Procedure(s) Performed: CYST EXCISION PILONIDAL EXTENSIVE  Patient location during evaluation: PACU Anesthesia Type: General Level of consciousness: awake and alert and oriented Pain management: pain level controlled Vital Signs Assessment: post-procedure vital signs reviewed and stable Respiratory status: spontaneous breathing, nonlabored ventilation and respiratory function stable Cardiovascular status: blood pressure returned to baseline and stable Postop Assessment: no signs of nausea or vomiting Anesthetic complications: no   No notable events documented.   Last Vitals:  Vitals:   07/27/21 1157 07/27/21 1214  BP: 111/73 (!) 137/98  Pulse:  71  Resp: 16 16  Temp: (!) 36.3 C (!) 36.4 C  SpO2:  99%    Last Pain:  Vitals:   07/27/21 1214  TempSrc: Oral  PainSc: 0-No pain                 Berel Najjar

## 2021-07-31 LAB — SURGICAL PATHOLOGY

## 2021-08-29 ENCOUNTER — Telehealth (INDEPENDENT_AMBULATORY_CARE_PROVIDER_SITE_OTHER): Payer: Self-pay | Admitting: Vascular Surgery

## 2021-08-29 NOTE — Telephone Encounter (Signed)
Called to check the status of laser procedure. Patient was last seen 07/03/21. Please advise.

## 2021-08-30 ENCOUNTER — Ambulatory Visit (INDEPENDENT_AMBULATORY_CARE_PROVIDER_SITE_OTHER): Payer: BC Managed Care – PPO | Admitting: Obstetrics and Gynecology

## 2021-08-30 ENCOUNTER — Encounter: Payer: Self-pay | Admitting: Obstetrics and Gynecology

## 2021-08-30 ENCOUNTER — Other Ambulatory Visit: Payer: Self-pay

## 2021-08-30 VITALS — BP 128/78 | HR 62 | Ht 66.0 in | Wt 219.0 lb

## 2021-08-30 DIAGNOSIS — N921 Excessive and frequent menstruation with irregular cycle: Secondary | ICD-10-CM | POA: Diagnosis not present

## 2021-08-30 NOTE — Patient Instructions (Signed)
Laparoscopically Assisted Vaginal Hysterectomy, Care After The following information offers guidance on how to care for yourself after your procedure. Your health care provider may also give you more specific instructions. If you have problems or questions, contact your health care provider. What can I expect after the procedure? After the procedure, it is common to have: Soreness and numbness in your incision areas. Abdominal pain. You will be given pain medicine to control it. Vaginal bleeding and discharge. You will need to use a sanitary pad after this procedure. Tiredness (fatigue). Poor appetite. Less interest in sex. Feelings of sadness or other emotions. If your ovaries were also removed, it is common to have symptoms of menopause, such as hot flashes, night sweats, and lack of sleep (insomnia). Follow these instructions at home: Medicines Take over-the-counter and prescription medicines only as told by your health care provider. Do not take aspirin or NSAIDs, such as ibuprofen. These medicines can cause bleeding. Ask your health care provider if the medicine prescribed to you: Requires you to avoid driving or using heavy machinery. Can cause constipation. You may need to take these actions to prevent or treat constipation: Drink enough fluid to keep your urine pale yellow. Take over-the-counter or prescription medicines. Eat foods that are high in fiber, such as beans, whole grains, and fresh fruits and vegetables. Limit foods that are high in fat and processed sugars, such as fried or sweet foods. Incision care  Follow instructions from your health care provider about how to take care of your incisions. Make sure you: Wash your hands with soap and water for at least 20 seconds before and after you change your bandage (dressing). If soap and water are not available, use hand sanitizer. Change your dressing as told by your health care provider. Leave stitches (sutures), skin glue,  or adhesive strips in place. These skin closures may need to stay in place for 2 weeks or longer. If adhesive strip edges start to loosen and curl up, you may trim the loose edges. Do not remove adhesive strips completely unless your health care provider tells you to do that. Check your incision areas every day for signs of infection. Check for: More redness, swelling, or pain. Fluid or blood. Warmth. Pus or a bad smell. Activity  Rest as told by your healthcare provider. Return to your normal activities as told by your health care provider. Ask your health care provider what activities are safe for you. Avoid sitting for a long time without moving. Get up to take short walks every 1-2 hours. This is important to improve blood flow and breathing. Ask for help if you feel weak or unsteady. Do not lift anything that is heavier than 10 lb (4.5 kg), or the limit that you are told, until your health care provider says that it is safe. If you were given a sedative during the procedure, it can affect you for several hours. Do not drive or operate machinery until your health care provider says that it is safe. Lifestyle Do not use any products that contain nicotine or tobacco. These products include cigarettes, chewing tobacco, and vaping devices, such as e-cigarettes. These can delay healing after surgery. If you need help quitting, ask your health care provider. Do not drink alcohol until your health care provider approves. General instructions  Do not douche, use tampons, or have sex for at least 6 weeks, or as told by your health care provider. If you struggle with physical or emotional changes after your procedure,  speak with your health care provider or a therapist. Do not take baths, swim, or use a hot tub until your health care provider approves. You may only be allowed to take showers for 2-3 weeks. Keep your dressing dry until your health care provider says it can be removed. Try to have  someone at home with you for the first 1-2 weeks to help with your daily chores. Wear compression stockings as told by your health care provider. These stockings help to prevent blood clots and reduce swelling in your legs. Keep all follow-up visits. This is important. Contact a health care provider if: You have any of these signs of infection: More redness, swelling, warmth, or pain around an incision. Fluid or blood coming from an incision. Pus or a bad smell coming from an incision. Chills or a fever. An incision opens. Your pain medicine is not helping. You feel dizzy or light-headed. You have pain or bleeding when you urinate. You have nausea and vomiting that does not go away. You have pus, or a bad-smelling discharge coming from your vagina. Get help right away if: You have a fever and your symptoms suddenly get worse. You have severe abdominal pain. You have chest pain. You have shortness of breath. You faint. You have pain, swelling, or redness in your leg. You have heavy vaginal bleeding and blood clots, soaking through a sanitary pad in less than 1 hour. These symptoms may represent a serious problem that is an emergency. Do not wait to see if the symptoms will go away. Get medical help right away. Call your local emergency services (911 in the [place] Do not drive yourself to the hospital. Summary After the procedure, it is common to have abdominal pain and vaginal bleeding. Wear a sanitary pad for vaginal discharge or bleeding. You should not drive or lift heavy objects until your health care provider says that it is safe. Contact your health care provider if you have any symptoms of infection, heavy vaginal bleeding, nausea, vomiting, or shortness of breath. This information is not intended to replace advice given to you by your health care provider. Make sure you discuss any questions you have with your health care provider. Document Revised: 07/14/2020 Document Reviewed:  07/14/2020 Elsevier Patient Education  2022 [place]. Hysterectomy Information A hysterectomy is a surgery in which the uterus is removed. The lowest part of the uterus (cervix), which opens into the vagina, may be removed as well. In some cases, the fallopian tubes, the ovaries,  or both the fallopian tubes and the ovaries may also be removed. This procedure may be done to treat different medical problems. It may also be done to help transgender men feel more masculine. After the procedure, a woman will no longer have menstrual periods and will not be able to become pregnant (sterile). What are the reasons for a hysterectomy? There are many reasons why a person might have this procedure. They include: Persistent, abnormal vaginal bleeding. Long-term (chronic) pelvic pain or infection. Endometriosis. This is when the lining of the uterus (endometrium) starts to grow outside the uterus. Adenomyosis. This is when the endometrium starts to grow in the muscle of the uterus. Pelvic organ prolapse. This is a condition in which the uterus falls down into the vagina. Noncancerous growths in the uterus (uterine fibroids) that cause symptoms. The presence of precancerous cells. Cervical or uterine cancer. Sex change. This helps a transgender man complete his female identity. What are the different types of hysterectomy? There  are three different types of hysterectomy: Supracervical hysterectomy. In this type, the top part of the uterus is removed, but not the cervix. Total hysterectomy. In this type, the uterus and cervix are removed. Radical hysterectomy. In this type, the uterus, the cervix, and the tissue that holds the uterus in place (parametrium) are removed. What are the different ways a hysterectomy can be performed? There are many different ways a hysterectomy can be performed, including: Abdominal hysterectomy. In this type, an incision is made in the abdomen. The uterus is removed through  this incision. Vaginal hysterectomy. In this type, an incision is made in the vagina. The uterus is removed through this incision. There are no abdominal incisions. Conventional laparoscopic hysterectomy. In this type, 3 or 4 small incisions are made in the abdomen. A thin, lighted tube with a camera (laparoscope) is inserted into one of the incisions. Other tools are put through the other incisions. The uterus is cut into small pieces. The small pieces are removed through the incisions or the vagina. Laparoscopically assisted vaginal hysterectomy (LAVH). In this type, 3 or 4 small incisions are made in the abdomen. Part of the surgery is performed laparoscopically and the other part is done vaginally. The uterus is removed through the vagina. Robot-assisted laparoscopic hysterectomy. In this type, a laparoscope and other tools are inserted into 3 or 4 small incisions in the abdomen. A computer-controlled device is used to give the surgeon a 3D image and to help control the surgical instruments. This allows for more precise movements of surgical instruments. The uterus is cut into small pieces and removed through the incisions or the vagina. Discuss the options with your health care provider to determine which type is the right one for you. What are the risks of this surgery? Generally, this is a safe procedure. However, problems may occur, including: Bleeding and risk of blood transfusion. Tell your health care provider if you do not want to receive any blood products. Blood clots in the legs or lung. Infection. Damage to nearby structures or organs. Allergic reactions to medicines. Having to change to an abdominal hysterectomy after starting a less invasive technique. What to expect after a hysterectomy You will be given pain medicine. You may need to stay in the hospital for 1-2 days to recover, depending on the type of hysterectomy you had. You will need to have someone with you for the first  3-5 days after you go home. You will need to follow up with your surgeon in 2-4 weeks after surgery to evaluate your progress. If the ovaries are removed, you will have early menopause symptoms such as hot flashes, night sweats, and insomnia. If you had a hysterectomy for a problem that was not cancer or a condition that could not lead to cancer, then you no longer need Pap tests. However, even if you no longer need a Pap test, get regular pelvic exams to make sure no other problems are developing. Questions to ask your health care provider Is a hysterectomy medically necessary? Do I have other treatment options for my condition? What are my options for hysterectomy procedure? What organs and tissues need to be removed? What are the risks? What are the benefits? How long will I need to stay in the hospital after the procedure? How long will I need to recover at home? What symptoms can I expect after the procedure? Summary A hysterectomy is a surgery in which the uterus is removed. The fallopian tubes, the ovaries, or both  may be removed as well. This procedure may be done to treat different medical problems. It may also be done to complete your female identity during a sex change. After the procedure, a woman will no longer have a menstrual period and will not be able to become pregnant. There are three types of hysterectomy. Discuss with your health care provider which options are right for you. This is a safe procedure, though there are some risks. Risks include infection, bleeding, blood clots, or damage to nearby organs. This information is not intended to replace advice given to you by your health care provider. Make sure you discuss any questions you have with your health care provider. Document Revised: 09/10/2019 Document Reviewed: 09/10/2019 Elsevier Patient Education  [place].

## 2021-08-30 NOTE — Progress Notes (Signed)
Patient ID: Katie Cross, female   DOB: 1980/12/11, 40 y.o.   MRN: [id number]  Reason for Consult: Pre-op Exam   Referred by Sofie Hartigan, MD  Subjective:     HPI:  Katie Cross is a 40 y.o. female she presents today for preoperative visit.  She has a hysterectomy planned for later this month.  She reports that since I last saw her her heavy menstrual bleeding has continued.  Her menstrual cycle is more regular occurring once a month.  She notes that the middle of her cycle days 3 through 4 tend to be the heaviest of bleeding.  She has no longer taking her progesterone only pill.  She has no new complaints.  Gynecological History  Patient's last menstrual period was 08/07/2021.  Past Medical History:  Diagnosis Date   Anemia    Appendicitis    Dysplastic nevus 08/07/2010   left med lower leg mild to moderate, left buttock moderate atypia   Dysplastic nevus 12/12/2015   right upper abdomen, halo nevus with mild atypia, limited margins free.   Dysplastic nevus 06/23/2018   left spinal lower back, moderate atypia, margins free.   Family history of breast cancer    pt declines MyRisk testing   Pelvic pain    Family History  Problem Relation Age of Onset   Hypertension Mother    Hyperlipidemia Mother    Hypertension Father    Hyperlipidemia Father    Breast cancer Maternal Aunt 50   Brain cancer Maternal Aunt    Breast cancer Paternal Aunt 70       BRCA neg   Colon cancer Paternal Aunt 19   Diabetes Mellitus II Paternal Grandfather    Past Surgical History:  Procedure Laterality Date   APPENDECTOMY     CESAREAN SECTION     x3   LAPAROSCOPY     PILONIDAL CYST EXCISION N/A 07/27/2021   Procedure: CYST EXCISION PILONIDAL EXTENSIVE;  Surgeon: Robert Bellow, MD;  Location: [facility] ORS;  Service: General;  Laterality: N/A;   TUBAL LIGATION      Short Social History:  Social History   Tobacco Use   Smoking status: Never   Smokeless tobacco: Never   Substance Use Topics   Alcohol use: No    No Known Allergies  Current Outpatient Medications  Medication Sig Dispense Refill   HYDROcodone-acetaminophen (NORCO/VICODIN) 5-325 MG tablet Take 1 tablet by mouth every 4 (four) hours as needed for moderate pain. (Patient not taking: Reported on 08/30/2021) 20 tablet 0   sulfamethoxazole-trimethoprim (BACTRIM DS) 800-160 MG tablet Take 1 tablet by mouth 2 (two) times daily. X 5 days prior to surgery (Patient not taking: Reported on 08/30/2021)     No current facility-administered medications for this visit.    Review of Systems  Constitutional: Negative for chills, fatigue, fever and unexpected weight change.  HENT: Negative for trouble swallowing.  Eyes: Negative for loss of vision.  Respiratory: Negative for cough, shortness of breath and wheezing.  Cardiovascular: Negative for chest pain, leg swelling, palpitations and syncope.  GI: Negative for abdominal pain, blood in stool, diarrhea, nausea and vomiting.  GU: Negative for difficulty urinating, dysuria, frequency and hematuria.  Musculoskeletal: Negative for back pain, leg pain and joint pain.  Skin: Negative for rash.  Neurological: Negative for dizziness, headaches, light-headedness, numbness and seizures.  Psychiatric: Negative for behavioral problem, confusion, depressed mood and sleep disturbance.       Objective:  Objective   Vitals:  08/30/21 1447  BP: 128/78  Pulse: 62  Weight: 219 lb (99.3 kg)  Height: _0  (1.676 m)   Body mass index is 35.35 kg/m.  Physical Exam Vitals and nursing note reviewed. Exam conducted with a chaperone present.  Constitutional:      Appearance: Normal appearance.  HENT:     Head: Normocephalic and atraumatic.  Eyes:     Extraocular Movements: Extraocular movements intact.     Pupils: Pupils are equal, round, and reactive to light.  Cardiovascular:     Rate and Rhythm: Normal rate and regular rhythm.  Pulmonary:     Effort:  Pulmonary effort is normal.     Breath sounds: Normal breath sounds.  Abdominal:     General: Abdomen is flat.     Palpations: Abdomen is soft.  Musculoskeletal:     Cervical back: Normal range of motion.  Skin:    General: Skin is warm and dry.  Neurological:     General: No focal deficit present.     Mental Status: She is alert and oriented to person, place, and time.  Psychiatric:        Behavior: Behavior normal.        Thought Content: Thought content normal.        Judgment: Judgment normal.    Assessment/Plan:    40 yo [id number] with a history of menorrhagia who desires hysterectomy as definitive therapy.  I have had a careful discussion with this patient about all the options available and the risk/benefits of each. I have fully informed this patient that a hysterectomy may subject her to a variety of discomforts and risks: She understands that most patients have surgery with little difficulty, but problems can happen ranging from minor to fatal. These include nausea, vomiting, pain, bleeding, infection, poor healing, hernia, wound separation, vaginal cuff separation or formation of adhesions. Unexpected reactions may occur from any drug or anesthetic given. Unintended injury may occur to other pelvic or abdominal structures such as Fallopian tubes, ovaries, bladder, ureter (tube from kidney to bladder), or bowel. Nerves going from the pelvis to the legs may be injured. Any such injury may require immediate or later additional surgery to correct the problem. Excessive blood loss requiring transfusion is very unlikely but possible. Dangerous blood clots may form in the legs or lungs. Physical and sexual activity will be restricted in varying degrees for an indeterminate period of time but most often 2-4 weeks. She understands that the plan is to do this laparoscopically, however, there is a chance that this will need to be performed via a larger incision. She may be hospitalized overnight.  Finally, she understands that it is impossible to list every possible undesirable effect and that the condition for which surgery is done is not always cured or significantly improved, and in rare cases may be even worse. I have also counseled her extensively about the pros and cons of ovarian conservation versus removal. Ample time was given to answer all questions.  More than 30 minutes were spent face to face with the patient in the room, reviewing the medical record, labs and images, and coordinating care for the patient. The plan of management was discussed in detail and counseling was provided.     Adrian Prows MD [facility], [facility] 08/30/2021 3:19 PM

## 2021-08-30 NOTE — H&P (View-Only) (Signed)
 Patient ID: Katie Cross, female   DOB: 09/27/1981, 40 y.o.   MRN: [id number]  Reason for Consult: Pre-op Exam   Referred by Feldpausch, Dale E, MD  Subjective:     HPI:  Katie Cross is a 40 y.o. female she presents today for preoperative visit.  She has a hysterectomy planned for later this month.  She reports that since I last saw her her heavy menstrual bleeding has continued.  Her menstrual cycle is more regular occurring once a month.  She notes that the middle of her cycle days 3 through 4 tend to be the heaviest of bleeding.  She has no longer taking her progesterone only pill.  She has no new complaints.  Gynecological History  Patient's last menstrual period was 08/07/2021.  Past Medical History:  Diagnosis Date   Anemia    Appendicitis    Dysplastic nevus 08/07/2010   left med lower leg mild to moderate, left buttock moderate atypia   Dysplastic nevus 12/12/2015   right upper abdomen, halo nevus with mild atypia, limited margins free.   Dysplastic nevus 06/23/2018   left spinal lower back, moderate atypia, margins free.   Family history of breast cancer    pt declines MyRisk testing   Pelvic pain    Family History  Problem Relation Age of Onset   Hypertension Mother    Hyperlipidemia Mother    Hypertension Father    Hyperlipidemia Father    Breast cancer Maternal Aunt 60   Brain cancer Maternal Aunt    Breast cancer Paternal Aunt 42       BRCA neg   Colon cancer Paternal Aunt 52   Diabetes Mellitus II Paternal Grandfather    Past Surgical History:  Procedure Laterality Date   APPENDECTOMY     CESAREAN SECTION     x3   LAPAROSCOPY     PILONIDAL CYST EXCISION N/A 07/27/2021   Procedure: CYST EXCISION PILONIDAL EXTENSIVE;  Surgeon: Byrnett, Jeffrey W, MD;  Location: [facility] ORS;  Service: General;  Laterality: N/A;   TUBAL LIGATION      Short Social History:  Social History   Tobacco Use   Smoking status: Never   Smokeless tobacco: Never   Substance Use Topics   Alcohol use: No    No Known Allergies  Current Outpatient Medications  Medication Sig Dispense Refill   HYDROcodone-acetaminophen (NORCO/VICODIN) 5-325 MG tablet Take 1 tablet by mouth every 4 (four) hours as needed for moderate pain. (Patient not taking: Reported on 08/30/2021) 20 tablet 0   sulfamethoxazole-trimethoprim (BACTRIM DS) 800-160 MG tablet Take 1 tablet by mouth 2 (two) times daily. X 5 days prior to surgery (Patient not taking: Reported on 08/30/2021)     No current facility-administered medications for this visit.    Review of Systems  Constitutional: Negative for chills, fatigue, fever and unexpected weight change.  HENT: Negative for trouble swallowing.  Eyes: Negative for loss of vision.  Respiratory: Negative for cough, shortness of breath and wheezing.  Cardiovascular: Negative for chest pain, leg swelling, palpitations and syncope.  GI: Negative for abdominal pain, blood in stool, diarrhea, nausea and vomiting.  GU: Negative for difficulty urinating, dysuria, frequency and hematuria.  Musculoskeletal: Negative for back pain, leg pain and joint pain.  Skin: Negative for rash.  Neurological: Negative for dizziness, headaches, light-headedness, numbness and seizures.  Psychiatric: Negative for behavioral problem, confusion, depressed mood and sleep disturbance.       Objective:  Objective   Vitals:     08/30/21 1447  BP: 128/78  Pulse: 62  Weight: 219 lb (99.3 kg)  Height: 5' 6" (1.676 m)   Body mass index is 35.35 kg/m.  Physical Exam Vitals and nursing note reviewed. Exam conducted with a chaperone present.  Constitutional:      Appearance: Normal appearance.  HENT:     Head: Normocephalic and atraumatic.  Eyes:     Extraocular Movements: Extraocular movements intact.     Pupils: Pupils are equal, round, and reactive to light.  Cardiovascular:     Rate and Rhythm: Normal rate and regular rhythm.  Pulmonary:     Effort:  Pulmonary effort is normal.     Breath sounds: Normal breath sounds.  Abdominal:     General: Abdomen is flat.     Palpations: Abdomen is soft.  Musculoskeletal:     Cervical back: Normal range of motion.  Skin:    General: Skin is warm and dry.  Neurological:     General: No focal deficit present.     Mental Status: She is alert and oriented to person, place, and time.  Psychiatric:        Behavior: Behavior normal.        Thought Content: Thought content normal.        Judgment: Judgment normal.    Assessment/Plan:    40 yo [id number] with a history of menorrhagia who desires hysterectomy as definitive therapy.  I have had a careful discussion with this patient about all the options available and the risk/benefits of each. I have fully informed this patient that a hysterectomy may subject her to a variety of discomforts and risks: She understands that most patients have surgery with little difficulty, but problems can happen ranging from minor to fatal. These include nausea, vomiting, pain, bleeding, infection, poor healing, hernia, wound separation, vaginal cuff separation or formation of adhesions. Unexpected reactions may occur from any drug or anesthetic given. Unintended injury may occur to other pelvic or abdominal structures such as Fallopian tubes, ovaries, bladder, ureter (tube from kidney to bladder), or bowel. Nerves going from the pelvis to the legs may be injured. Any such injury may require immediate or later additional surgery to correct the problem. Excessive blood loss requiring transfusion is very unlikely but possible. Dangerous blood clots may form in the legs or lungs. Physical and sexual activity will be restricted in varying degrees for an indeterminate period of time but most often 2-4 weeks. She understands that the plan is to do this laparoscopically, however, there is a chance that this will need to be performed via a larger incision. She may be hospitalized overnight.  Finally, she understands that it is impossible to list every possible undesirable effect and that the condition for which surgery is done is not always cured or significantly improved, and in rare cases may be even worse. I have also counseled her extensively about the pros and cons of ovarian conservation versus removal. Ample time was given to answer all questions.  More than 30 minutes were spent face to face with the patient in the room, reviewing the medical record, labs and images, and coordinating care for the patient. The plan of management was discussed in detail and counseling was provided.     Draco Malczewski MD [facility], [facility] 08/30/2021 3:19 PM    

## 2021-09-03 ENCOUNTER — Encounter: Payer: BC Managed Care – PPO | Admitting: Obstetrics and Gynecology

## 2021-09-06 ENCOUNTER — Encounter
Admission: RE | Admit: 2021-09-06 | Discharge: 2021-09-06 | Disposition: A | Discharge status: Home / Self Care | Payer: BC Managed Care – PPO | Source: Ambulatory Visit | Attending: Obstetrics and Gynecology | Admitting: Obstetrics and Gynecology

## 2021-09-06 ENCOUNTER — Other Ambulatory Visit: Payer: Self-pay

## 2021-09-11 ENCOUNTER — Other Ambulatory Visit: Payer: Self-pay

## 2021-09-11 ENCOUNTER — Encounter (INDEPENDENT_AMBULATORY_CARE_PROVIDER_SITE_OTHER): Payer: Self-pay

## 2021-09-11 ENCOUNTER — Encounter
Admission: RE | Admit: 2021-09-11 | Discharge: 2021-09-11 | Disposition: A | Discharge status: Home / Self Care | Payer: BC Managed Care – PPO | Source: Ambulatory Visit | Attending: Obstetrics and Gynecology | Admitting: Obstetrics and Gynecology

## 2021-09-11 DIAGNOSIS — N838 Other noninflammatory disorders of ovary, fallopian tube and broad ligament: Secondary | ICD-10-CM | POA: Diagnosis not present

## 2021-09-11 DIAGNOSIS — N840 Polyp of corpus uteri: Secondary | ICD-10-CM | POA: Diagnosis not present

## 2021-09-11 DIAGNOSIS — N939 Abnormal uterine and vaginal bleeding, unspecified: Secondary | ICD-10-CM | POA: Insufficient documentation

## 2021-09-11 DIAGNOSIS — Z01812 Encounter for preprocedural laboratory examination: Secondary | ICD-10-CM | POA: Insufficient documentation

## 2021-09-11 DIAGNOSIS — N8003 Adenomyosis of the uterus: Secondary | ICD-10-CM | POA: Diagnosis not present

## 2021-09-11 DIAGNOSIS — N920 Excessive and frequent menstruation with regular cycle: Secondary | ICD-10-CM | POA: Diagnosis not present

## 2021-09-11 LAB — CBC
HCT: 40.7 % (ref 36.0–46.0)
Hemoglobin: 13.8 g/dL (ref 12.0–15.0)
MCH: 31 pg (ref 26.0–34.0)
MCHC: 33.9 g/dL (ref 30.0–36.0)
MCV: 91.5 fL (ref 80.0–100.0)
Platelets: 322 10*3/uL (ref 150–400)
RBC: 4.45 MIL/uL (ref 3.87–5.11)
RDW: 12.5 % (ref 11.5–15.5)
WBC: 7 10*3/uL (ref 4.0–10.5)
nRBC: 0 % (ref 0.0–0.2)

## 2021-09-12 LAB — TYPE AND SCREEN
ABO/RH(D): O POS
Antibody Screen: NEGATIVE

## 2021-09-13 ENCOUNTER — Other Ambulatory Visit: Payer: Self-pay

## 2021-09-13 ENCOUNTER — Ambulatory Visit: Payer: BC Managed Care – PPO | Admitting: Certified Registered"

## 2021-09-13 ENCOUNTER — Ambulatory Visit
Admission: RE | Admit: 2021-09-13 | Discharge: 2021-09-13 | Disposition: A | Discharge status: Home / Self Care | Payer: BC Managed Care – PPO | Attending: Obstetrics and Gynecology | Admitting: Obstetrics and Gynecology

## 2021-09-13 ENCOUNTER — Encounter: Payer: Self-pay | Admitting: Obstetrics and Gynecology

## 2021-09-13 ENCOUNTER — Encounter
Admission: RE | Disposition: A | Discharge status: Home / Self Care | Payer: Self-pay | Source: Home / Self Care | Attending: Obstetrics and Gynecology

## 2021-09-13 DIAGNOSIS — N920 Excessive and frequent menstruation with regular cycle: Secondary | ICD-10-CM | POA: Diagnosis not present

## 2021-09-13 DIAGNOSIS — N840 Polyp of corpus uteri: Secondary | ICD-10-CM | POA: Insufficient documentation

## 2021-09-13 DIAGNOSIS — N921 Excessive and frequent menstruation with irregular cycle: Secondary | ICD-10-CM

## 2021-09-13 DIAGNOSIS — N8003 Adenomyosis of the uterus: Secondary | ICD-10-CM | POA: Insufficient documentation

## 2021-09-13 DIAGNOSIS — N838 Other noninflammatory disorders of ovary, fallopian tube and broad ligament: Secondary | ICD-10-CM | POA: Insufficient documentation

## 2021-09-13 DIAGNOSIS — N939 Abnormal uterine and vaginal bleeding, unspecified: Secondary | ICD-10-CM

## 2021-09-13 HISTORY — PX: ROBOTIC ASSISTED LAPAROSCOPIC HYSTERECTOMY AND SALPINGECTOMY: SHX6379

## 2021-09-13 HISTORY — PX: CYSTOSCOPY: SHX5120

## 2021-09-13 LAB — ABO/RH: ABO/RH(D): O POS

## 2021-09-13 LAB — POCT PREGNANCY, URINE: Preg Test, Ur: NEGATIVE

## 2021-09-13 SURGERY — XI ROBOTIC ASSISTED LAPAROSCOPIC HYSTERECTOMY AND SALPINGECTOMY
Anesthesia: General

## 2021-09-13 MED ORDER — KETOROLAC TROMETHAMINE 30 MG/ML IJ SOLN
INTRAMUSCULAR | Status: DC | PRN
Start: 2021-09-13 — End: 2021-09-13
  Administered 2021-09-13: 30 mg via INTRAVENOUS

## 2021-09-13 MED ORDER — ACETAMINOPHEN 500 MG PO TABS: 1000.0000 mg | ORAL_TABLET | Freq: Four times a day (QID) | ORAL | 0 refills | Status: DC | PRN

## 2021-09-13 MED ORDER — ORAL CARE MOUTH RINSE: 15.0000 mL | Freq: Once | OROMUCOSAL | Status: AC

## 2021-09-13 MED ORDER — BUPIVACAINE LIPOSOME 1.3 % IJ SUSP
INTRAMUSCULAR | Status: AC
  Filled 2021-09-13: qty 20

## 2021-09-13 MED ORDER — PROPOFOL 10 MG/ML IV BOLUS
INTRAVENOUS | Status: AC
  Filled 2021-09-13: qty 20

## 2021-09-13 MED ORDER — ONDANSETRON HCL 4 MG/2ML IJ SOLN
4.0000 mg | Freq: Once | INTRAMUSCULAR | Status: AC | PRN
  Administered 2021-09-13: 4 mg via INTRAVENOUS

## 2021-09-13 MED ORDER — 0.9 % SODIUM CHLORIDE (POUR BTL) OPTIME
TOPICAL | Status: DC | PRN
  Administered 2021-09-13 (×2): 500 mL

## 2021-09-13 MED ORDER — KETAMINE HCL 50 MG/5ML IJ SOSY
PREFILLED_SYRINGE | INTRAMUSCULAR | Status: AC
  Filled 2021-09-13: qty 5

## 2021-09-13 MED ORDER — MIDAZOLAM HCL 2 MG/2ML IJ SOLN
INTRAMUSCULAR | Status: DC | PRN
  Administered 2021-09-13: 2 mg via INTRAVENOUS

## 2021-09-13 MED ORDER — CEFAZOLIN SODIUM-DEXTROSE 2-4 GM/100ML-% IV SOLN
INTRAVENOUS | Status: AC
  Filled 2021-09-13: qty 100

## 2021-09-13 MED ORDER — IBUPROFEN 600 MG PO TABS: 600.0000 mg | ORAL_TABLET | Freq: Four times a day (QID) | ORAL | 0 refills | Status: DC | PRN

## 2021-09-13 MED ORDER — POVIDONE-IODINE 10 % EX SWAB
2.0000 "application " | Freq: Once | CUTANEOUS | Status: AC
  Administered 2021-09-13: 2 via TOPICAL

## 2021-09-13 MED ORDER — FAMOTIDINE 20 MG PO TABS: 20.0000 mg | ORAL_TABLET | Freq: Once | ORAL | Status: DC

## 2021-09-13 MED ORDER — FAMOTIDINE 20 MG PO TABS
ORAL_TABLET | ORAL | Status: AC
  Administered 2021-09-13: 20 mg via ORAL
  Filled 2021-09-13: qty 1

## 2021-09-13 MED ORDER — FENTANYL CITRATE (PF) 100 MCG/2ML IJ SOLN: 25.0000 ug | INTRAMUSCULAR | Status: DC | PRN

## 2021-09-13 MED ORDER — PROPOFOL 10 MG/ML IV BOLUS
INTRAVENOUS | Status: DC | PRN
  Administered 2021-09-13: 160 mg via INTRAVENOUS

## 2021-09-13 MED ORDER — CEFAZOLIN SODIUM-DEXTROSE 2-4 GM/100ML-% IV SOLN
2.0000 g | INTRAVENOUS | Status: AC
  Administered 2021-09-13: 2 g via INTRAVENOUS

## 2021-09-13 MED ORDER — ACETAMINOPHEN 10 MG/ML IV SOLN
INTRAVENOUS | Status: AC
  Filled 2021-09-13: qty 100

## 2021-09-13 MED ORDER — FAMOTIDINE 20 MG PO TABS: 20.0000 mg | ORAL_TABLET | Freq: Once | ORAL | Status: AC

## 2021-09-13 MED ORDER — OXYCODONE HCL 5 MG PO TABS: 5.0000 mg | ORAL_TABLET | Freq: Three times a day (TID) | ORAL | 0 refills | Status: DC | PRN

## 2021-09-13 MED ORDER — ENOXAPARIN SODIUM 40 MG/0.4ML IJ SOSY
PREFILLED_SYRINGE | INTRAMUSCULAR | Status: AC
  Administered 2021-09-13: 40 mg via SUBCUTANEOUS
  Filled 2021-09-13: qty 0.4

## 2021-09-13 MED ORDER — SUGAMMADEX SODIUM 200 MG/2ML IV SOLN
INTRAVENOUS | Status: DC | PRN
  Administered 2021-09-13: 200 mg via INTRAVENOUS

## 2021-09-13 MED ORDER — MEPERIDINE HCL 25 MG/ML IJ SOLN: 6.2500 mg | INTRAMUSCULAR | Status: DC | PRN

## 2021-09-13 MED ORDER — CHLORHEXIDINE GLUCONATE 0.12 % MT SOLN: 15.0000 mL | Freq: Once | OROMUCOSAL | Status: AC

## 2021-09-13 MED ORDER — BUPIVACAINE LIPOSOME 1.3 % IJ SUSP
INTRAMUSCULAR | Status: DC | PRN
  Administered 2021-09-13: 20 mL

## 2021-09-13 MED ORDER — FUROSEMIDE 10 MG/ML IJ SOLN
INTRAMUSCULAR | Status: DC | PRN
  Administered 2021-09-13: 5 mg via INTRAMUSCULAR

## 2021-09-13 MED ORDER — FENTANYL CITRATE (PF) 100 MCG/2ML IJ SOLN
INTRAMUSCULAR | Status: AC
  Filled 2021-09-13: qty 2

## 2021-09-13 MED ORDER — KETAMINE HCL 10 MG/ML IJ SOLN
INTRAMUSCULAR | Status: DC | PRN
  Administered 2021-09-13: 20 mg via INTRAVENOUS
  Administered 2021-09-13: 30 mg via INTRAVENOUS

## 2021-09-13 MED ORDER — ENOXAPARIN SODIUM 40 MG/0.4ML IJ SOSY: 40.0000 mg | PREFILLED_SYRINGE | INTRAMUSCULAR | Status: AC

## 2021-09-13 MED ORDER — ONDANSETRON HCL 4 MG/2ML IJ SOLN
INTRAMUSCULAR | Status: AC
  Filled 2021-09-13: qty 2

## 2021-09-13 MED ORDER — ROCURONIUM BROMIDE 100 MG/10ML IV SOLN
INTRAVENOUS | Status: DC | PRN
  Administered 2021-09-13: 20 mg via INTRAVENOUS
  Administered 2021-09-13: 50 mg via INTRAVENOUS
  Administered 2021-09-13 (×2): 10 mg via INTRAVENOUS
  Administered 2021-09-13: 20 mg via INTRAVENOUS

## 2021-09-13 MED ORDER — CHLORHEXIDINE GLUCONATE 0.12 % MT SOLN
OROMUCOSAL | Status: AC
  Administered 2021-09-13: 15 mL via OROMUCOSAL
  Filled 2021-09-13: qty 15

## 2021-09-13 MED ORDER — MIDAZOLAM HCL 2 MG/2ML IJ SOLN
INTRAMUSCULAR | Status: AC
  Filled 2021-09-13: qty 2

## 2021-09-13 MED ORDER — METHYLENE BLUE 0.5 % INJ SOLN
INTRAVENOUS | Status: AC
  Filled 2021-09-13: qty 10

## 2021-09-13 MED ORDER — OXYCODONE HCL 5 MG PO TABS
ORAL_TABLET | ORAL | Status: AC
  Filled 2021-09-13: qty 1

## 2021-09-13 MED ORDER — ACETAMINOPHEN 10 MG/ML IV SOLN
INTRAVENOUS | Status: DC | PRN
  Administered 2021-09-13: 1000 mg via INTRAVENOUS

## 2021-09-13 MED ORDER — LACTATED RINGERS IV SOLN: INTRAVENOUS | Status: DC

## 2021-09-13 MED ORDER — LIDOCAINE HCL (CARDIAC) PF 100 MG/5ML IV SOSY
PREFILLED_SYRINGE | INTRAVENOUS | Status: DC | PRN
  Administered 2021-09-13: 50 mg via INTRAVENOUS

## 2021-09-13 MED ORDER — ONDANSETRON HCL 4 MG/2ML IJ SOLN
INTRAMUSCULAR | Status: DC | PRN
  Administered 2021-09-13: 4 mg via INTRAVENOUS

## 2021-09-13 MED ORDER — DEXAMETHASONE SODIUM PHOSPHATE 10 MG/ML IJ SOLN
INTRAMUSCULAR | Status: DC | PRN
  Administered 2021-09-13: 10 mg via INTRAVENOUS

## 2021-09-13 MED ORDER — METHYLENE BLUE 0.5 % INJ SOLN
INTRAVENOUS | Status: DC | PRN
  Administered 2021-09-13 (×2): 25 mg via INTRAVENOUS

## 2021-09-13 MED ORDER — OXYCODONE HCL 5 MG PO TABS
5.0000 mg | ORAL_TABLET | ORAL | Status: AC
  Administered 2021-09-13: 5 mg via ORAL

## 2021-09-13 MED ORDER — FENTANYL CITRATE (PF) 100 MCG/2ML IJ SOLN
INTRAMUSCULAR | Status: DC | PRN
  Administered 2021-09-13 (×2): 50 ug via INTRAVENOUS

## 2021-09-13 SURGICAL SUPPLY — 101 items
ADH SKN CLS APL DERMABOND .7 (GAUZE/BANDAGES/DRESSINGS) ×2
APL PRP STRL LF DISP 70% ISPRP (MISCELLANEOUS) ×2
APL SRG 38 LTWT LNG FL B (MISCELLANEOUS)
APPLICATOR ARISTA FLEXITIP XL (MISCELLANEOUS) IMPLANT
BAG DRN RND TRDRP ANRFLXCHMBR (UROLOGICAL SUPPLIES) ×2
BAG LAPAROSCOPIC 12 15 PORT 16 (BASKET) IMPLANT
BAG RETRIEVAL 12/15 (BASKET)
BAG URINE DRAIN 2000ML AR STRL (UROLOGICAL SUPPLIES) ×3 IMPLANT
BASIN GRAD PLASTIC 32OZ STRL (MISCELLANEOUS) ×2 IMPLANT
BLADE SURG SZ11 CARB STEEL (BLADE) ×3 IMPLANT
CANNULA CAP OBTURATR AIRSEAL 8 (CAP) ×3 IMPLANT
CATH FOLEY 2WAY  5CC 16FR (CATHETERS) ×3
CATH FOLEY 2WAY 5CC 16FR (CATHETERS) ×2
CATH URTH 16FR FL 2W BLN LF (CATHETERS) ×2 IMPLANT
CHLORAPREP W/TINT 26 (MISCELLANEOUS) ×3 IMPLANT
COVER MAYO STAND REUSABLE (DRAPES) ×3 IMPLANT
COVER TIP SHEARS 8 DVNC (MISCELLANEOUS) ×2 IMPLANT
COVER TIP SHEARS 8MM DA VINCI (MISCELLANEOUS) ×3
DEFOGGER SCOPE WARMER CLEARIFY (MISCELLANEOUS) ×3 IMPLANT
DERMABOND ADVANCED (GAUZE/BANDAGES/DRESSINGS) ×1
DERMABOND ADVANCED .7 DNX12 (GAUZE/BANDAGES/DRESSINGS) ×2 IMPLANT
DRAPE 3/4 80X56 (DRAPES) ×9 IMPLANT
DRAPE ARM DVNC X/XI (DISPOSABLE) ×8 IMPLANT
DRAPE COLUMN DVNC XI (DISPOSABLE) ×2 IMPLANT
DRAPE DA VINCI XI ARM (DISPOSABLE) ×12
DRAPE DA VINCI XI COLUMN (DISPOSABLE) ×3
DRAPE ROBOT W/ LEGGING 30X125 (DRAPES) ×3 IMPLANT
DRAPE UNDER BUTTOCK W/FLU (DRAPES) ×1 IMPLANT
DRSG TEGADERM 2-3/8X2-3/4 SM (GAUZE/BANDAGES/DRESSINGS) ×12 IMPLANT
ELECT REM PT RETURN 9FT ADLT (ELECTROSURGICAL) ×3
ELECTRODE REM PT RTRN 9FT ADLT (ELECTROSURGICAL) ×2 IMPLANT
EXTRT SYSTEM ALEXIS 17CM (MISCELLANEOUS)
GAUZE 4X4 16PLY ~~LOC~~+RFID DBL (SPONGE) ×6 IMPLANT
GLOVE SURG SYN 6.5 ES PF (GLOVE) ×12 IMPLANT
GLOVE SURG SYN 6.5 PF PI (GLOVE) ×6 IMPLANT
GLOVE SURG UNDER POLY LF SZ6.5 (GLOVE) ×3 IMPLANT
GOWN STRL REUS W/ TWL LRG LVL3 (GOWN DISPOSABLE) ×6 IMPLANT
GOWN STRL REUS W/TWL LRG LVL3 (GOWN DISPOSABLE) ×12
GRASPER SUT TROCAR 14GX15 (MISCELLANEOUS) IMPLANT
GUIDEWIRE STR DUAL SENSOR (WIRE) ×1 IMPLANT
HEMOSTAT ARISTA ABSORB 3G PWDR (HEMOSTASIS) IMPLANT
IRRIGATION STRYKERFLOW (MISCELLANEOUS) IMPLANT
IRRIGATOR STRYKERFLOW (MISCELLANEOUS) ×3
IV NS 1000ML (IV SOLUTION) ×3
IV NS 1000ML BAXH (IV SOLUTION) IMPLANT
KIT PINK PAD W/HEAD ARE REST (MISCELLANEOUS) ×3
KIT PINK PAD W/HEAD ARM REST (MISCELLANEOUS) ×2 IMPLANT
KIT TURNOVER CYSTO (KITS) ×3 IMPLANT
LABEL OR SOLS (LABEL) ×3 IMPLANT
MANIFOLD NEPTUNE II (INSTRUMENTS) ×3 IMPLANT
MANIPULATOR VCARE LG CRV RETR (MISCELLANEOUS) ×1 IMPLANT
MANIPULATOR VCARE SML CRV RETR (MISCELLANEOUS) IMPLANT
MANIPULATOR VCARE STD CRV RETR (MISCELLANEOUS) IMPLANT
NDL INSUFF 14G 150MM VS150000 (NEEDLE) IMPLANT
NEEDLE HYPO 22GX1.5 SAFETY (NEEDLE) ×3 IMPLANT
NEEDLE VERESS 14GA 120MM (NEEDLE) ×2 IMPLANT
NS IRRIG 1000ML POUR BTL (IV SOLUTION) ×4 IMPLANT
OBTURATOR OPTICAL STANDARD 8MM (TROCAR) ×3
OBTURATOR OPTICAL STND 8 DVNC (TROCAR) ×2
OBTURATOR OPTICALSTD 8 DVNC (TROCAR) ×2 IMPLANT
OCCLUDER COLPOPNEUMO (BALLOONS) ×3 IMPLANT
PACK LAP CHOLECYSTECTOMY (MISCELLANEOUS) ×3 IMPLANT
PAD OB MATERNITY 4.3X12.25 (PERSONAL CARE ITEMS) ×3 IMPLANT
PAD PREP 24X41 OB/GYN DISP (PERSONAL CARE ITEMS) ×3 IMPLANT
PENCIL ELECTRO HAND CTR (MISCELLANEOUS) ×3 IMPLANT
RETRACTOR WOUND ALXS 18CM SML (MISCELLANEOUS) IMPLANT
RTRCTR WOUND ALEXIS O 18CM SML (MISCELLANEOUS)
SCRUB EXIDINE 4% CHG 4OZ (MISCELLANEOUS) ×3 IMPLANT
SEAL CANN UNIV 5-8 DVNC XI (MISCELLANEOUS) ×6 IMPLANT
SEAL XI 5MM-8MM UNIVERSAL (MISCELLANEOUS) ×9
SEALER VESSEL DA VINCI XI (MISCELLANEOUS) ×3
SEALER VESSEL EXT DVNC XI (MISCELLANEOUS) ×2 IMPLANT
SET CYSTO W/LG BORE CLAMP LF (SET/KITS/TRAYS/PACK) ×3 IMPLANT
SET TUBE FILTERED XL AIRSEAL (SET/KITS/TRAYS/PACK) ×3 IMPLANT
SOLUTION ELECTROLUBE (MISCELLANEOUS) ×3 IMPLANT
SPONGE GAUZE 2X2 8PLY STRL LF (GAUZE/BANDAGES/DRESSINGS) ×6 IMPLANT
STRAP SAFETY 5IN WIDE (MISCELLANEOUS) ×6 IMPLANT
SURGILUBE 2OZ TUBE FLIPTOP (MISCELLANEOUS) ×3 IMPLANT
SUT DVC VLOC 180 0 12IN GS21 (SUTURE)
SUT ETHIBOND NAB CT1 #1 30IN (SUTURE) ×3 IMPLANT
SUT MNCRL 4-0 (SUTURE) ×3
SUT MNCRL 4-0 27XMFL (SUTURE) ×2
SUT VIC AB 0 CT1 36 (SUTURE) ×3 IMPLANT
SUT VIC AB 0 UR5 27 (SUTURE) ×3 IMPLANT
SUT VIC AB 1 CT1 36 (SUTURE) ×6 IMPLANT
SUT VIC AB 2-0 CT1 27 (SUTURE)
SUT VIC AB 2-0 CT1 TAPERPNT 27 (SUTURE) ×2 IMPLANT
SUT VIC AB 3-0 SH 27 (SUTURE)
SUT VIC AB 3-0 SH 27X BRD (SUTURE) ×4 IMPLANT
SUT VICRYL 0 AB UR-6 (SUTURE) ×4 IMPLANT
SUT VLOC 180 0 6IN GS21 (SUTURE) ×4 IMPLANT
SUTURE DVC VLC 180 0 12IN GS21 (SUTURE) ×2 IMPLANT
SUTURE MNCRL 4-0 27XMF (SUTURE) ×2 IMPLANT
SYR 10ML LL (SYRINGE) ×3 IMPLANT
SYR 50ML LL SCALE MARK (SYRINGE) ×3 IMPLANT
SYR TOOMEY IRRIG 70ML (MISCELLANEOUS) ×3
SYRINGE TOOMEY IRRIG 70ML (MISCELLANEOUS) IMPLANT
SYSTEM CONTND EXTRCTN KII BLLN (MISCELLANEOUS) IMPLANT
TOWEL OR 17X26 4PK STRL BLUE (TOWEL DISPOSABLE) ×1 IMPLANT
TROCAR BALLN GELPORT 12X130M (ENDOMECHANICALS) IMPLANT
WATER STERILE IRR 500ML POUR (IV SOLUTION) ×2 IMPLANT

## 2021-09-13 NOTE — Op Note (Signed)
Date of procedure: 09/13/21  Preoperative diagnosis:  Menorrhagia  Postoperative diagnosis:  Same  Procedure: Cystoscopy  Surgeon: Nickolas Madrid, MD  Anesthesia: General  Complications: None  Intraoperative findings:  Normal cystoscopy, ureteral orifices orthotopic bilaterally, bilaterally ureteral peristalsis with efflux Sensor wire advanced easily into both ureters to anticipated location of kidneys  EBL: None for urology portion  Specimens: None for urology portion  Drains: None for urology portion  Indication: Katie Cross is a 40 y.o. patient with menorrhagia who underwent a robotic hysterectomy with Dr Gilman Schmidt.  Urology was consulted intraoperatively for concern for absence of efflux from the ureters bilaterally.  They have previously given methylene blue and Lasix, and urine in the catheter bag was blue with good output, but they were not able to definitively see efflux.  Description of procedure:  When I arrived in the room the patient was intubated and in the low lithotomy position.  They were using a 70 degree lens and cystoscope and the ureters were identified bilaterally.  I scrubbed in and changed to a 30 degree lens with the rigid cystoscope and thorough cystoscopy was performed.  The bladder was grossly normal, and the ureteral orifices orthotopic bilaterally.  I emptied the bladder and could clearly see peristalsis of the ureters bilaterally, with what appeared to be clear efflux of urine.  The patient was on a robotic table, so retrograde pyelograms could not be performed immediately.  I opted to gently advance a sensor wire to evaluate for any possible ureteral injury.  I started on the left side and the sensor wire advanced easily up to the expected location of the kidney.  I then advanced a sensor wire easily on the right side and this again passed easily up to the anticipated location of the kidney and no resistance was felt on either side.  Low suspicion for  ureteral injury.  We discussed other options like moving the patient to a slider table and performing retrograde pyelograms, but with our low suspicion for ureteral injury, we deferred.  The case was returned to the GYN team.  We discussed return precautions including flank pain that would warrant a CT urogram.  Disposition: Case returned to GYN team   Nickolas Madrid, MD

## 2021-09-13 NOTE — Interval H&P Note (Signed)
History and Physical Interval Note:  09/13/2021 10:29 AM  Katie Cross  has presented today for surgery, with the diagnosis of Menorrhagia with irregular cycle.  The various methods of treatment have been discussed with the patient and family. After consideration of risks, benefits and other options for treatment, the patient has consented to  Procedure(s): XI ROBOTIC ASSISTED LAPAROSCOPIC HYSTERECTOMY AND SALPINGECTOMY (Bilateral) CYSTOSCOPY (N/A) as a surgical intervention.  The patient's history has been reviewed, patient examined, no change in status, stable for surgery.  I have reviewed the patient's chart and labs.  Questions were answered to the patient's satisfaction.     [place]

## 2021-09-13 NOTE — Discharge Instructions (Addendum)
AMBULATORY SURGERY  DISCHARGE INSTRUCTIONS   The drugs that you were given will stay in your system until tomorrow so for the next 24 hours you should not:  Drive an automobile Make any legal decisions Drink any alcoholic beverage   You may resume regular meals tomorrow.  Today it is better to start with liquids and gradually work up to solid foods.  You may eat anything you prefer, but it is better to start with liquids, then soup and crackers, and gradually work up to solid foods.   Please notify your doctor immediately if you have any unusual bleeding, trouble breathing, redness and pain at the surgery site, drainage, fever, or pain not relieved by medication.   .                                Additional Instructions: DO NOT REMOVE TEAL BRACELET FOR 4 DAYS (96 HOURS)  Information for Discharge Teaching: EXPAREL (bupivacaine liposome injectable suspension)   Your surgeon or anesthesiologist gave you EXPAREL(bupivacaine) to help control your pain after surgery.  EXPAREL is a local anesthetic that provides pain relief by numbing the tissue around the surgical site. EXPAREL is designed to release pain medication over time and can control pain for up to 72 hours. Depending on how you respond to EXPAREL, you may require less pain medication during your recovery.  Possible side effects: Temporary loss of sensation or ability to move in the area where bupivacaine was injected. Nausea, vomiting, constipation Rarely, numbness and tingling in your mouth or lips, lightheadedness, or anxiety may occur. Call your doctor right away if you think you may be experiencing any of these sensations, or if you have other questions regarding possible side effects.  Follow all other discharge instructions given to you by your surgeon or nurse. Eat a healthy diet and drink plenty of water or other fluids.  If you return to the hospital for any reason within 96 hours following the administration of  EXPAREL, it is important for health care providers to know that you have received this anesthetic. A teal colored band has been placed on your arm with the date, time and amount of EXPAREL you have received in order to alert and inform your health care providers. Please leave this armband in place for the full 96 hours following administration, and then you may remove the band.

## 2021-09-13 NOTE — Anesthesia Procedure Notes (Signed)
Procedure Name: Intubation Date/Time: 09/13/2021 10:49 AM Performed by: Biagio Borg, CRNA Pre-anesthesia Checklist: Patient identified, Emergency Drugs available, Suction available and Patient being monitored Patient Re-evaluated:Patient Re-evaluated prior to induction Oxygen Delivery Method: Circle system utilized Preoxygenation: Pre-oxygenation with 100% oxygen Induction Type: IV induction Ventilation: Mask ventilation without difficulty Laryngoscope Size: McGraph and 3 Grade View: Grade I Tube type: Oral Tube size: 7.0 mm Number of attempts: 1 Airway Equipment and Method: Stylet Placement Confirmation: ETT inserted through vocal cords under direct vision, positive ETCO2 and breath sounds checked- equal and bilateral Secured at: 21 cm Tube secured with: Tape Dental Injury: Teeth and Oropharynx as per pre-operative assessment

## 2021-09-13 NOTE — Anesthesia Preprocedure Evaluation (Signed)
Anesthesia Evaluation  Patient identified by MRN, date of birth, ID band Patient awake    Reviewed: Allergy & Precautions, NPO status , Patient's Chart, lab work & pertinent test results  History of Anesthesia Complications Negative for: history of anesthetic complications  Airway Mallampati: II  TM Distance: >3 FB Neck ROM: Full    Dental no notable dental hx.    Pulmonary neg pulmonary ROS, neg sleep apnea, neg COPD,    breath sounds clear to auscultation- rhonchi (-) wheezing      Cardiovascular Exercise Tolerance: Good (-) hypertension(-) CAD, (-) Past MI, (-) Cardiac Stents and (-) CABG  Rhythm:Regular Rate:Normal - Systolic murmurs and - Diastolic murmurs    Neuro/Psych  Headaches, neg Seizures negative psych ROS   GI/Hepatic negative GI ROS, Neg liver ROS,   Endo/Other  negative endocrine ROSneg diabetes  Renal/GU negative Renal ROS     Musculoskeletal negative musculoskeletal ROS (+)   Abdominal (+) + obese,   Peds  Hematology negative hematology ROS (+) anemia ,   Anesthesia Other Findings Anemia    Appendicitis    Dysplastic nevus 08/07/2010 left med lower leg mild to moderate, left buttock moderate atypia  Dysplastic nevus 12/12/2015 right upper abdomen, halo nevus with mild atypia, limited margins free.  Dysplastic nevus 06/23/2018 left spinal lower back, moderate atypia, margins free.  Family history of breast cancer  pt declines MyRisk testing  Pelvic pain        Reproductive/Obstetrics                             Anesthesia Physical  Anesthesia Plan  ASA: 2  Anesthesia Plan: General   Post-op Pain Management:    Induction: Intravenous  PONV Risk Score and Plan: 2 and Propofol infusion, Ondansetron and Midazolam  Airway Management Planned: Oral ETT  Additional Equipment:   Intra-op Plan:   Post-operative Plan:   Informed Consent: I have reviewed the  patients History and Physical, chart, labs and discussed the procedure including the risks, benefits and alternatives for the proposed anesthesia with the patient or authorized representative who has indicated his/her understanding and acceptance.     Dental advisory given  Plan Discussed with: CRNA, Anesthesiologist and Surgeon  Anesthesia Plan Comments:        Anesthesia Quick Evaluation

## 2021-09-13 NOTE — Anesthesia Postprocedure Evaluation (Signed)
Anesthesia Post Note  Patient: Katie Cross  Procedure(s) Performed: XI ROBOTIC ASSISTED LAPAROSCOPIC TOTAL HYSTERECTOMY AND BILATERAL  SALPINGECTOMY (Bilateral) CYSTOSCOPY  Patient location during evaluation: PACU Anesthesia Type: General Level of consciousness: awake and alert, awake and oriented Pain management: pain level controlled Vital Signs Assessment: post-procedure vital signs reviewed and stable Respiratory status: spontaneous breathing, nonlabored ventilation and respiratory function stable Cardiovascular status: blood pressure returned to baseline and stable Postop Assessment: no apparent nausea or vomiting Anesthetic complications: no   No notable events documented.   Last Vitals:  Vitals:   09/13/21 1530 09/13/21 1554  BP: 119/74 135/79  Pulse: 85 72  Resp: 17 16  Temp:  (!) 36.3 C  SpO2: 93% 99%    Last Pain:  Vitals:   09/13/21 1554  TempSrc: Tympanic  PainSc: 2                  Phill Mutter

## 2021-09-13 NOTE — Op Note (Signed)
Operative Note   PRE-OP DIAGNOSIS: Menorrhagia   POST-OP DIAGNOSIS: Menorrhagia  SURGEON: Adrian Prows MD  ANESTHESIA: General  PROCEDURE: Procedure(s):Robotic assisted total hysterectomy and bilateral salpingectomy, cystoscopy    ESTIMATED BLOOD LOSS: 20 cc  DRAINS: Foley  SPECIMENS:   Uterus, cervix, bilateral fallopian tubes  COMPLICATIONS: None  DISPOSITION: PACU  CONDITION: Stable  INDICATIONS: Menorrhagia  FINDINGS: Exam under anesthesia revealed an enlarged 12  week uterus. There was without adnexal masses or nodularity. The parametria was smooth. The cervix was negative for gross lesions. Intraoperative findings included: The uterus was  grossly normal. The adnexa were normal bilaterally. The upper abdomen was normal including omentum, bowel, liver, stomach, and diaphragmatic surfaces. There was no evidence of grossly enlarged pelvic or right para-aortic lymph nodes.   PROCEDURE IN DETAIL: After informed consent was obtained, the patient was taken to the operating room where anesthesia was obtained without difficulty. The patient was positioned in the dorsal lithotomy position in [place] and her arms were carefully tucked at her sides and the usual precautions were taken.  She was prepped and draped in normal sterile fashion.  Time-out was performed. A foley catheter was placed. A speculum was placed in the vagina and the cervical os was dilated. The uterus sounded to 12 cm.  A standard VCare uterine manipulator was then placed in the uterus without incident.    The right fallopian tube was identified and followed up to the fimbria.  The vessel sealer was used to seal and cut the fallopian tube from the broad ligament.  The uterine ovarian ligament was sealed and coagulated with the vessel sealer.  The right round ligament was divided. The broad ligament was dissected and a bladder flap was created. The bladder was dissected down off the lower uterine segment and  cervix. There were significant adhesion between the bladder and the anterior surface of the uterus. The uterine artery was skeletonized, sealed and divided with the Vesselsealer device or cautery. This same procedure was repeated on the left.   A colpotomy was performed circumferentially along the V-Care ring with electrocautery and the cervix was incised from the vagina. The uterus, fallopian tubes, and v-care device were removed through the vagina.   A pneumo balloon was placed in the vagina and the vaginal cuff was then closed in a running continuous fashion using 0 V-Lock suture with careful attention to include the vaginal cuff angles and the vaginal mucosa within the closure.    Arista was applied to the vaginal cuff.     The trocars were removed.  The skin incision at the umbilicus was closed with subcuticular stitch.  The remaining skin incisions were closed with subcuticular stitch and Indermil glue.   Cystoscopy was performed. Patient's urine was very dilute in color. Efflux of urine was noted from the right ureter.  It was difficult to visualize efflux of urine from the left ureter although peristalsis was noted.  Urology was consulted. They were able to easily pass a guidewire bilaterally and because of this did not have concern for ureteral injury. Please see Dr. Doristine Counter operative report for further details.  The cystoscope was removed from the bladder and the bladder was drained.     The patient tolerated the procedure well.  Sponge, lap and needle counts were correct x2.  The patient was taken to recovery room in excellent condition.  Antibiotics: Given 1st or 2nd generation cephalosporin, Antibiotics given within 1 hour of the start of the procedure, Antibiotics ordered  to be discontinued within 24 hours post procedure  VTE prophylaxis: Lovenox was ordered and  given perioperatively.  Adrian Prows MD, [facility], [facility] 09/13/2021 3:13  PM

## 2021-09-13 NOTE — Transfer of Care (Signed)
Immediate Anesthesia Transfer of Care Note  Patient: Katie Cross  Procedure(s) Performed: XI ROBOTIC ASSISTED LAPAROSCOPIC TOTAL HYSTERECTOMY AND BILATERAL  SALPINGECTOMY (Bilateral) CYSTOSCOPY  Patient Location: PACU  Anesthesia Type:General  Level of Consciousness: awake  Airway & Oxygen Therapy: Patient Spontanous Breathing and Patient connected to face mask oxygen  Post-op Assessment: Report given to RN and Post -op Vital signs reviewed and stable  Post vital signs: Reviewed and stable  Last Vitals:  Vitals Value Taken Time  BP    Temp    Pulse 87 09/13/21 1508  Resp 13 09/13/21 1508  SpO2 99 % 09/13/21 1508  Vitals shown include unvalidated device data.  Last Pain:  Vitals:   09/13/21 0825  TempSrc: Temporal  PainSc: 0-No pain         Complications: No notable events documented.

## 2021-09-14 ENCOUNTER — Encounter: Payer: Self-pay | Admitting: Obstetrics and Gynecology

## 2021-09-17 LAB — SURGICAL PATHOLOGY

## 2021-09-20 ENCOUNTER — Ambulatory Visit (INDEPENDENT_AMBULATORY_CARE_PROVIDER_SITE_OTHER): Payer: BC Managed Care – PPO | Admitting: Obstetrics and Gynecology

## 2021-09-20 ENCOUNTER — Ambulatory Visit: Payer: BC Managed Care – PPO | Admitting: Dermatology

## 2021-09-20 ENCOUNTER — Encounter: Payer: Self-pay | Admitting: Obstetrics and Gynecology

## 2021-09-20 ENCOUNTER — Other Ambulatory Visit: Payer: Self-pay

## 2021-09-20 VITALS — BP 130/72 | Ht 66.0 in | Wt 220.0 lb

## 2021-09-20 DIAGNOSIS — Z4889 Encounter for other specified surgical aftercare: Secondary | ICD-10-CM

## 2021-09-20 DIAGNOSIS — Z1211 Encounter for screening for malignant neoplasm of colon: Secondary | ICD-10-CM

## 2021-09-20 NOTE — Progress Notes (Signed)
  Postoperative Follow-up Patient presents post op from  [facility] Robot-assisted Laparoscopic Hysterectomy and Bilateral Salpingectomy  for  menorrhagia , 1 week ago.  Subjective: She reports that since the surgery she has been feeling well. Patient reports some improvement in her preop symptoms. Eating a regular diet without difficulty.  Pain is controlled without any medications.   Activity: sedentary.   Objective: BP 130/72   Ht 5\' 6"  (1.676 m)   Wt 220 lb (99.8 kg)   LMP 08/23/2021 (Approximate) Comment: U preg negative. Reviewed by Marya Amsler, RN  BMI 35.51 kg/m  Physical Exam Constitutional:      Appearance: Normal appearance. She is well-developed.  HENT:     Head: Normocephalic and atraumatic.  Eyes:     Extraocular Movements: Extraocular movements intact.     Pupils: Pupils are equal, round, and reactive to light.  Neck:     Thyroid: No thyromegaly.  Cardiovascular:     Rate and Rhythm: Normal rate and regular rhythm.     Heart sounds: Normal heart sounds.  Pulmonary:     Effort: Pulmonary effort is normal.     Breath sounds: Normal breath sounds.  Abdominal:     General: Bowel sounds are normal. There is no distension.     Palpations: Abdomen is soft. There is no mass.     Comments: Incision clean dry intact  Musculoskeletal:     Cervical back: Neck supple.  Neurological:     Mental Status: She is alert and oriented to person, place, and time.  Skin:    General: Skin is warm and dry.  Psychiatric:        Behavior: Behavior normal.        Thought Content: Thought content normal.        Judgment: Judgment normal.  Vitals reviewed.    Assessment: s/p :  Xi Robot-assisted Laparoscopic Hysterectomy and Bilateral Salpingectomy stable  Plan:  Patient has done well after surgery with no apparent complications.   Referral to GI for history of colon polyps in 2006.  I have discussed the post-operative course to date, and the expected progress moving forward.  The  patient understands what complications to be concerned about.  I will see the patient in routine follow up, or sooner if needed.    Activity plan: No heavy lifting. No intercourse.     Adrian Prows MD, [place], [facility] 09/20/2021 11:10 AM

## 2021-10-01 ENCOUNTER — Other Ambulatory Visit: Payer: Self-pay

## 2021-10-01 MED ORDER — PEG 3350-KCL-NA BICARB-NACL 420 G PO SOLR: 4000.0000 mL | Freq: Once | ORAL | 0 refills | Status: DC

## 2021-10-01 NOTE — Progress Notes (Signed)
Patient will have to be seen in the office due to age.

## 2021-10-02 ENCOUNTER — Other Ambulatory Visit: Payer: Self-pay

## 2021-10-02 DIAGNOSIS — Z800 Family history of malignant neoplasm of digestive organs: Secondary | ICD-10-CM

## 2021-10-02 DIAGNOSIS — Z1211 Encounter for screening for malignant neoplasm of colon: Secondary | ICD-10-CM

## 2021-10-02 MED ORDER — PEG 3350-KCL-NA BICARB-NACL 420 G PO SOLR: 4000.0000 mL | Freq: Once | ORAL | 0 refills | Status: AC

## 2021-10-02 NOTE — Progress Notes (Signed)
Gastroenterology Pre-Procedure Review  Request Date: 11/12/21 Requesting Physician: Dr. Allen Norris  PATIENT REVIEW QUESTIONS: The patient responded to the following health history questions as indicated:    1. Are you having any GI issues? no 2. Do you have a personal history of Polyps? yes (2006 polyps removed; per ginger ok to schedule for colonoscopy.) 3. Do you have a family history of Colon Cancer or Polyps? yes (Mother & Father: polyps; P. Aunt: colon cancer) 4. Diabetes Mellitus? no 5. Joint replacements in the past 12 months?no 6. Major health problems in the past 3 months?yes (Total Hysterectomy in Oct.) 7. Any artificial heart valves, MVP, or defibrillator?no    MEDICATIONS & ALLERGIES:    Patient reports the following regarding taking any anticoagulation/antiplatelet therapy:   Plavix, Coumadin, Eliquis, Xarelto, Lovenox, Pradaxa, Brilinta, or Effient? no Aspirin? no  Patient confirms/reports the following medications:  Current Outpatient Medications  Medication Sig Dispense Refill   acetaminophen (TYLENOL) 500 MG tablet Take 2 tablets (1,000 mg total) by mouth every 6 (six) hours as needed. 60 tablet 0   CINNAMON PO Take 1 tablet by mouth daily. (Patient not taking: Reported on 09/20/2021)     ibuprofen (ADVIL) 600 MG tablet Take 1 tablet (600 mg total) by mouth every 6 (six) hours as needed. 60 tablet 0   oxyCODONE (ROXICODONE) 5 MG immediate release tablet Take 1 tablet (5 mg total) by mouth every 8 (eight) hours as needed. (Patient not taking: Reported on 09/20/2021) 20 tablet 0   vitamin B-12 (CYANOCOBALAMIN) 1000 MCG tablet Take 1,000 mcg by mouth daily. (Patient not taking: Reported on 09/20/2021)     No current facility-administered medications for this visit.    Patient confirms/reports the following allergies:  No Known Allergies  No orders of the defined types were placed in this encounter.   AUTHORIZATION INFORMATION Primary Insurance: 1D#: Group  #:  Secondary Insurance: 1D#: Group #:  SCHEDULE INFORMATION: Date: 11/12/21 Time: Location: [place]

## 2021-10-24 ENCOUNTER — Ambulatory Visit (INDEPENDENT_AMBULATORY_CARE_PROVIDER_SITE_OTHER): Payer: BC Managed Care – PPO | Admitting: Dermatology

## 2021-10-24 ENCOUNTER — Other Ambulatory Visit: Payer: Self-pay

## 2021-10-24 DIAGNOSIS — D225 Melanocytic nevi of trunk: Secondary | ICD-10-CM

## 2021-10-24 DIAGNOSIS — L814 Other melanin hyperpigmentation: Secondary | ICD-10-CM

## 2021-10-24 DIAGNOSIS — D1801 Hemangioma of skin and subcutaneous tissue: Secondary | ICD-10-CM

## 2021-10-24 DIAGNOSIS — Z1283 Encounter for screening for malignant neoplasm of skin: Secondary | ICD-10-CM

## 2021-10-24 DIAGNOSIS — D23111 Other benign neoplasm of skin of right upper eyelid, including canthus: Secondary | ICD-10-CM | POA: Diagnosis not present

## 2021-10-24 DIAGNOSIS — Z86018 Personal history of other benign neoplasm: Secondary | ICD-10-CM

## 2021-10-24 DIAGNOSIS — L570 Actinic keratosis: Secondary | ICD-10-CM | POA: Diagnosis not present

## 2021-10-24 DIAGNOSIS — L821 Other seborrheic keratosis: Secondary | ICD-10-CM

## 2021-10-24 DIAGNOSIS — L578 Other skin changes due to chronic exposure to nonionizing radiation: Secondary | ICD-10-CM

## 2021-10-24 DIAGNOSIS — L738 Other specified follicular disorders: Secondary | ICD-10-CM

## 2021-10-24 DIAGNOSIS — D485 Neoplasm of uncertain behavior of skin: Secondary | ICD-10-CM | POA: Diagnosis not present

## 2021-10-24 DIAGNOSIS — D369 Benign neoplasm, unspecified site: Secondary | ICD-10-CM

## 2021-10-24 DIAGNOSIS — D2372 Other benign neoplasm of skin of left lower limb, including hip: Secondary | ICD-10-CM

## 2021-10-24 DIAGNOSIS — L720 Epidermal cyst: Secondary | ICD-10-CM

## 2021-10-24 DIAGNOSIS — D239 Other benign neoplasm of skin, unspecified: Secondary | ICD-10-CM

## 2021-10-24 NOTE — Patient Instructions (Addendum)
Wound Care Instructions  Cleanse wound gently with soap and water once a day then pat dry with clean gauze. Apply a thing coat of Petrolatum (petroleum jelly, "Vaseline") over the wound (unless you have an allergy to this). We recommend that you use a new, sterile tube of Vaseline. Do not pick or remove scabs. Do not remove the yellow or white "healing tissue" from the base of the wound.  Cover the wound with fresh, clean, nonstick gauze and secure with paper tape. You may use Band-Aids in place of gauze and tape if the would is small enough, but would recommend trimming much of the tape off as there is often too much. Sometimes Band-Aids can irritate the skin.  You should call the office for your biopsy report after 1 week if you have not already been contacted.  If you experience any problems, such as abnormal amounts of bleeding, swelling, significant bruising, significant pain, or evidence of infection, please call the office immediately.  FOR ADULT SURGERY PATIENTS: If you need something for pain relief you may take 1 extra strength Tylenol (acetaminophen) AND 2 Ibuprofen (200mg  each) together every 4 hours as needed for pain. (do not take these if you are allergic to them or if you have a reason you should not take them.) Typically, you may only need pain medication for 1 to 3 days.   Cryotherapy Aftercare  Wash gently with soap and water everyday.   Apply Vaseline and Band-Aid daily until healed.   Prior to procedure, discussed risks of blister formation, small wound, skin dyspigmentation, or rare scar following cryotherapy. Recommend Vaseline ointment to treated areas while healing.  Melanoma ABCDEs  Melanoma is the most dangerous type of skin cancer, and is the leading cause of death from skin disease.  You are more likely to develop melanoma if you: Have light-colored skin, light-colored eyes, or red or blond hair Spend a lot of time in the sun Tan regularly, either outdoors or in a  tanning bed Have had blistering sunburns, especially during childhood Have a close family member who has had a melanoma Have atypical moles or large birthmarks  Early detection of melanoma is key since treatment is typically straightforward and cure rates are extremely high if we catch it early.   The first sign of melanoma is often a change in a mole or a new dark spot.  The ABCDE system is a way of remembering the signs of melanoma.  A for asymmetry:  The two halves do not match. B for border:  The edges of the growth are irregular. C for color:  A mixture of colors are present instead of an even brown color. D for diameter:  Melanomas are usually (but not always) greater than 6mm - the size of a pencil eraser. E for evolution:  The spot keeps changing in size, shape, and color.  Please check your skin once per month between visits. You can use a small mirror in front and a large mirror behind you to keep an eye on the back side or your body.   If you see any new or changing lesions before your next follow-up, please call to schedule a visit.  Please continue daily skin protection including broad spectrum sunscreen SPF 30+ to sun-exposed areas, reapplying every 2 hours as needed when you're outdoors.    If You Need Anything After Your Visit  If you have any questions or concerns for your doctor, please call our main line at [phone] and  press option 4 to reach your doctor's medical assistant. If no one answers, please leave a voicemail as directed and we will return your call as soon as possible. Messages left after 4 pm will be answered the following business day.   You may also send [place] a message via [place]. We typically respond to MyChart messages within 1-2 business days.  For prescription refills, please ask your pharmacy to contact our office. Our fax number is [phone].  If you have an urgent issue when the clinic is closed that cannot wait until the next business day,  you can page your doctor at the number below.    Please note that while we do our best to be available for urgent issues outside of office hours, we are not available 24/7.   If you have an urgent issue and are unable to reach [place], you may choose to seek medical care at your doctor's office, retail clinic, urgent care center, or emergency room.  If you have a medical emergency, please immediately call 911 or go to the emergency department.  Pager Numbers  - Dr. Nehemiah Massed: [phone]  - Dr. Laurence Ferrari: [phone]  - Dr. Nicole Kindred: [phone]  In the event of inclement weather, please call our main line at [phone] for an update on the status of any delays or closures.  Dermatology Medication Tips: Please keep the boxes that topical medications come in in order to help keep track of the instructions about where and how to use these. Pharmacies typically print the medication instructions only on the boxes and not directly on the medication tubes.   If your medication is too expensive, please contact our office at [phone] option 4 or send [place] a message through [place].   We are unable to tell what your co-pay for medications will be in advance as this is different depending on your insurance coverage. However, we may be able to find a substitute medication at lower cost or fill out paperwork to get insurance to cover a needed medication.   If a prior authorization is required to get your medication covered by your insurance company, please allow [place] 1-2 business days to complete this process.  Drug prices often vary depending on where the prescription is filled and some pharmacies may offer cheaper prices.  The website www.goodrx.com contains coupons for medications through different pharmacies. The prices here do not account for what the cost may be with help from insurance (it may be cheaper with your insurance), but the website can give you the price if you did not use any insurance.   - You can print the associated coupon and take it with your prescription to the pharmacy.  - You may also stop by our office during regular business hours and pick up a GoodRx coupon card.  - If you need your prescription sent electronically to a different pharmacy, notify our office through [facility] or by phone at [phone] option 4.     Si Usted Necesita Algo Despus de Su Visita  Tambin puede enviarnos un mensaje a travs de Pharmacist, community. Por lo general respondemos a los mensajes de MyChart en el transcurso de 1 a 2 das hbiles.  Para renovar recetas, por favor pida a su farmacia que se ponga en contacto con nuestra oficina. Harland Dingwall de fax es [place] [phone].  Si tiene un asunto urgente cuando la clnica est cerrada y que no puede esperar hasta el siguiente da hbil, puede llamar/localizar a su doctor(a) al [employer]  que aparece a continuacin.   Por favor, tenga en cuenta que aunque hacemos todo lo posible para estar disponibles para asuntos urgentes fuera del horario de [place], no estamos disponibles las 24 horas del da, los 7 das de la [place].   Si tiene un problema urgente y no puede comunicarse con nosotros, puede optar por buscar atencin mdica  en el consultorio de su doctor(a), en una clnica privada, en un centro de atencin urgente o en una sala de emergencias.  Si tiene Engineering geologist, por favor llame inmediatamente al 911 o vaya a la sala de emergencias.  Nmeros de bper  - Dr. Nehemiah Massed: [phone]  - Dra. Moye: [phone]  - Dra. Nicole Kindred: [phone]  En caso de inclemencias del [place], por favor llame a Johnsie Kindred principal al [phone] para una actualizacin sobre el [place] de cualquier retraso o cierre.  Consejos para la medicacin en dermatologa: Por favor, guarde las cajas en las que vienen los medicamentos de uso tpico para ayudarle a seguir las instrucciones sobre dnde y cmo usarlos. Las farmacias generalmente  imprimen las instrucciones del medicamento slo en las cajas y no directamente en los tubos del [place].   Si su medicamento es muy caro, por favor, pngase en contacto con Zigmund Daniel llamando al [phone] y presione la opcin 4 o envenos un mensaje a travs de Pharmacist, community.   No podemos decirle cul ser su copago por los medicamentos por adelantado ya que esto es diferente dependiendo de la cobertura de su seguro. Sin embargo, es posible que podamos encontrar un medicamento sustituto a Electrical engineer un formulario para que el seguro cubra el medicamento que se considera necesario.   Si se requiere una autorizacin previa para que su compaa de seguros [place] su medicamento, por favor permtanos de 1 a 2 das hbiles para completar este proceso.  Los precios de los medicamentos varan con frecuencia dependiendo del Environmental consultant de dnde se surte la receta y alguna farmacias pueden ofrecer precios ms baratos.  El sitio web www.goodrx.com tiene cupones para medicamentos de Airline pilot. Los precios aqu no tienen en cuenta lo que podra costar con la ayuda del seguro (puede ser ms barato con su seguro), pero el sitio web puede darle el precio si no utiliz Research scientist (physical sciences).  - Puede imprimir el cupn correspondiente y llevarlo con su receta a la farmacia.  - Tambin puede pasar por nuestra oficina durante el horario de atencin regular y Charity fundraiser una tarjeta de cupones de GoodRx.  - Si necesita que su receta se enve electrnicamente a una farmacia diferente, informe a nuestra oficina a travs de MyChart de [place] o por telfono llamando al [phone] y presione la opcin 4.

## 2021-10-24 NOTE — Progress Notes (Signed)
Follow-Up Visit   Subjective  Katie Cross is a 40 y.o. female who presents for the following: FBSE (Patient here for full body skin exam and skin cancer screening. Patient with hx of dysplastic nevus. Patient is not aware of any new or changing spots. ).  The following portions of the chart were reviewed this encounter and updated as appropriate:   Tobacco  Allergies  Meds  Problems  Med Hx  Surg Hx  Fam Hx      Review of Systems:  No other skin or systemic complaints except as noted in HPI or Assessment and Plan.  Objective  Well appearing patient in no apparent distress; mood and affect are within normal limits.  A full examination was performed including scalp, head, eyes, ears, nose, lips, neck, chest, axillae, abdomen, back, buttocks, bilateral upper extremities, bilateral lower extremities, hands, feet, fingers, toes, fingernails, and toenails. All findings within normal limits unless otherwise noted below.  right nasal ala 0.2 cm yellowish papule R/o Sebaceous Hyperplasia vs Verruca over other      Left Upper Back 0.6 cm irregular medium to dark brown thin papule R/o Atypia       left buttock 0.5 cm irregular medium to dark brown thin papule       Right Eyelid Erythematous red papule 0.25 cm      left nasal dorsum Erythematous thin papules/macules with gritty scale.   Left Lateral Thigh Firm pink/brown papulenodule with dimple sign.   left chest Subcutaneous nodule.    Assessment & Plan  Neoplasm of uncertain behavior of skin (3) right nasal ala  Epidermal / dermal shaving  Lesion diameter (cm):  0.2 Informed consent: discussed and consent obtained   Timeout: patient name, date of birth, surgical site, and procedure verified   Patient was prepped and draped in usual sterile fashion: area prepped with isopropyl alcohol. Anesthesia: the lesion was anesthetized in a standard fashion   Anesthetic:  1% lidocaine w/ epinephrine  1-100,000 buffered w/ 8.4% NaHCO3 Instrument used: flexible razor blade   Hemostasis achieved with: aluminum chloride   Outcome: patient tolerated procedure well   Post-procedure details: wound care instructions given   Additional details:  Mupirocin and a bandage applied  Specimen 1 - Surgical pathology Differential Diagnosis: R/o Sebaceous Hyperplasia vs Verruca over other  Check Margins: No 0.2 cm yellowish papule   Left Upper Back  Epidermal / dermal shaving  Lesion diameter (cm):  0.6 Informed consent: discussed and consent obtained   Timeout: patient name, date of birth, surgical site, and procedure verified   Patient was prepped and draped in usual sterile fashion: area prepped with isopropyl alcohol. Anesthesia: the lesion was anesthetized in a standard fashion   Anesthetic:  1% lidocaine w/ epinephrine 1-100,000 buffered w/ 8.4% NaHCO3 Instrument used: flexible razor blade   Hemostasis achieved with: aluminum chloride   Outcome: patient tolerated procedure well   Post-procedure details: wound care instructions given   Additional details:  Mupirocin and a bandage applied  Specimen 2 - Surgical pathology Differential Diagnosis: R/o Atypia  Check Margins: No 0.6 cm irregular medium to dark brown thin papule   left buttock  Epidermal / dermal shaving  Lesion diameter (cm):  0.5 Informed consent: discussed and consent obtained   Timeout: patient name, date of birth, surgical site, and procedure verified   Patient was prepped and draped in usual sterile fashion: area prepped with isopropyl alcohol. Anesthesia: the lesion was anesthetized in a standard fashion   Anesthetic:  1% lidocaine w/ epinephrine 1-100,000 buffered w/ 8.4% NaHCO3 Instrument used: flexible razor blade   Hemostasis achieved with: aluminum chloride   Outcome: patient tolerated procedure well   Post-procedure details: wound care instructions given   Additional details:  Mupirocin and a bandage  applied  Specimen 3 - Surgical pathology Differential Diagnosis: R/o Atypia  Check Margins: No 0.5 cm irregular medium to dark brown thin papule  Spot at right nasal ala irritated, symptomatic  Benign neoplasm Right Eyelid  Symptomatic c/w irritated hemangioma  Destruction of lesion - Right Eyelid  Destruction method: electrodesiccation and curettage   Destruction method comment:  Electrodessication only Informed consent: discussed and consent obtained   Timeout:  patient name, date of birth, surgical site, and procedure verified Patient was prepped and draped in usual sterile fashion: area prepped with isopropyl alcohol. Anesthesia: the lesion was anesthetized in a standard fashion   Anesthetic:  1% lidocaine w/ epinephrine 1-100,000 buffered w/ 8.4% NaHCO3 Hemostasis achieved with:  electrodesiccation Outcome: patient tolerated procedure well with no complications   Post-procedure details: wound care instructions given   Additional details:  Mupirocin and a pressure dressing applied  AK (actinic keratosis) left nasal dorsum  Prior to procedure, discussed risks of blister formation, small wound, skin dyspigmentation, or rare scar following cryotherapy. Recommend Vaseline ointment to treated areas while healing.  Actinic keratoses are precancerous spots that appear secondary to cumulative UV radiation exposure/sun exposure over time. They are chronic with expected duration over 1 year. A portion of actinic keratoses will progress to squamous cell carcinoma of the skin. It is not possible to reliably predict which spots will progress to skin cancer and so treatment is recommended to prevent development of skin cancer.  Recommend daily broad spectrum sunscreen SPF 30+ to sun-exposed areas, reapply every 2 hours as needed.  Recommend staying in the shade or wearing long sleeves, sun glasses (UVA+UVB protection) and wide brim hats (4-inch brim around the entire circumference of the  hat). Call for new or changing lesions.   Destruction of lesion - left nasal dorsum  Destruction method: cryotherapy   Informed consent: discussed and consent obtained   Lesion destroyed using liquid nitrogen: Yes   Cryotherapy cycles:  2 Outcome: patient tolerated procedure well with no complications   Post-procedure details: wound care instructions given    Dermatofibroma Left Lateral Thigh  Benign-appearing.  Observation.  Call clinic for new or changing lesions.    Epidermal inclusion cyst left chest  Benign-appearing. Exam most consistent with an epidermal inclusion cyst. Discussed that a cyst is a benign growth that can grow over time and sometimes get irritated or inflamed. Recommend observation if it is not bothersome. Discussed option of surgical excision to remove it if it is growing, symptomatic, or other changes noted. Please call for new or changing lesions so they can be evaluated.    Lentigines - Scattered tan macules - Due to sun exposure - Benign-appearing, observe - Recommend daily broad spectrum sunscreen SPF 30+ to sun-exposed areas, reapply every 2 hours as needed. - Call for any changes  Seborrheic Keratoses - Stuck-on, waxy, tan-brown papules and/or plaques  - Benign-appearing - Discussed benign etiology and prognosis. - Observe - Call for any changes  Melanocytic Nevi - Tan-brown and/or pink-flesh-colored symmetric macules and papules - Benign appearing on exam today - Observation - Call clinic for new or changing moles - Recommend daily use of broad spectrum spf 30+ sunscreen to sun-exposed areas.   Hemangiomas - Red papules -  Discussed benign nature - Observe - Call for any changes  Actinic Damage - Chronic condition, secondary to cumulative UV/sun exposure - diffuse scaly erythematous macules with underlying dyspigmentation - Recommend daily broad spectrum sunscreen SPF 30+ to sun-exposed areas, reapply every 2 hours as needed.  -  Staying in the shade or wearing long sleeves, sun glasses (UVA+UVB protection) and wide brim hats (4-inch brim around the entire circumference of the hat) are also recommended for sun protection.  - Call for new or changing lesions.  Skin cancer screening performed today.  History of Dysplastic Nevi - No evidence of recurrence today - Recommend regular full body skin exams - Recommend daily broad spectrum sunscreen SPF 30+ to sun-exposed areas, reapply every 2 hours as needed.  - Call if any new or changing lesions are noted between office visits  Return in about 1 year (around 10/24/2022) for TBSE.  Graciella Belton, RMA, am acting as scribe for Forest Gleason, MD .  Documentation: I have reviewed the above documentation for accuracy and completeness, and I agree with the above.  Forest Gleason, MD

## 2021-10-29 ENCOUNTER — Encounter: Payer: Self-pay | Admitting: Dermatology

## 2021-10-30 ENCOUNTER — Telehealth: Payer: Self-pay

## 2021-10-30 NOTE — Telephone Encounter (Signed)
Patient advised of BX results. She has been scheduled next Tuesday at 11am per Dr. Robbi Garter request. She will call back if she does not work for her.

## 2021-10-30 NOTE — Telephone Encounter (Signed)
-----   Message from Alfonso Patten, MD sent at 10/29/2021  8:19 PM EST ----- CC Johnsie Kindred Estill Bamberg, can we see if Emmary wants the next Tuesday 11 am surgery slot for excision of severe dysplastic before otherwise opening to regular appointments?  1. Skin , right nasal ala SURFACE OF SEBACEOUS GLAND HYPERPLASIA "Dilated oil gland" no additional treatment needed  2. 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 On the spectrum from normal mole to melanoma skin cancer, this is in between the two but closer towards a melanoma skin cancer.  - The treatment of choice for severely atypical moles is to cut them out in clinic with an area of normal looking skin around them to get all the atypical cells out. The skin that is removed will be sent to check under the microscope again to be sure it looks completely out.   - People who have a history of atypical moles do have a slightly increased risk of developing melanoma somewhere on the body, so a full body skin exam by a dermatologist is recommended at least once a year. - Monthly self skin checks and daily sun protection are also recommended.  - Please also call if you notice any new or changing spots anywhere else on the body before your follow-up visit.   3. Skin , left buttock DYSPLASTIC JUNCTIONAL NEVUS WITH MODERATE ATYPIA, CLOSE TO MARGIN  This is a MODERATELY ATYPICAL MOLE. On the spectrum from normal mole to melanoma skin cancer, this is in between the two. - We need to recheck this area sometime in the next 6 months to be sure there is no evidence of the atypical mole coming back. If there is any color coming back, we would recommend repeating the biopsy to be sure the cells look normal.  - People who have a history of atypical moles do have a slightly increased risk of developing melanoma somewhere on the body, so a yearly full  body skin exam by a dermatologist is recommended.  - Monthly self skin checks and daily sun protection are also recommended.  - Please call if you notice a dark spot coming back where this biopsy was taken.  - Please also call if you notice any new or changing spots anywhere else on the body before your follow-up visit.

## 2021-11-01 ENCOUNTER — Ambulatory Visit: Payer: BC Managed Care – PPO | Admitting: Obstetrics and Gynecology

## 2021-11-01 ENCOUNTER — Encounter: Payer: Self-pay | Admitting: Dermatology

## 2021-11-01 ENCOUNTER — Encounter: Payer: Self-pay | Admitting: Gastroenterology

## 2021-11-05 ENCOUNTER — Encounter: Payer: Self-pay | Admitting: Obstetrics and Gynecology

## 2021-11-05 ENCOUNTER — Other Ambulatory Visit: Payer: Self-pay

## 2021-11-05 ENCOUNTER — Ambulatory Visit (INDEPENDENT_AMBULATORY_CARE_PROVIDER_SITE_OTHER): Payer: BC Managed Care – PPO | Admitting: Obstetrics and Gynecology

## 2021-11-05 VITALS — BP 120/70 | Ht 66.0 in | Wt 224.4 lb

## 2021-11-05 DIAGNOSIS — Z4889 Encounter for other specified surgical aftercare: Secondary | ICD-10-CM

## 2021-11-05 NOTE — Progress Notes (Signed)
  Postoperative Follow-up Patient presents post op from  [facility] Robot-assisted Laparoscopic Hysterectomy and Bilateral Salpingectomy  for  menorrhagia , 2 months ago.  Subjective: She reports that since the surgery she has been feeling well, no issues. Patient reports marked improvement in her preop symptoms. Eating a regular diet without difficulty.  The patient is not having any pain.   Activity: normal activities of daily living .   Objective: LMP 08/23/2021 (Approximate) Comment: U preg negative. Reviewed by Marya Amsler, RN Physical Exam Constitutional:      Appearance: She is well-developed.  Genitourinary:     Genitourinary Comments: External: Normal appearing vulva. No lesions noted.  Speculum examination: Normal appearing vaginal cuff  Bimanual examination: Cuff is intact.  No CMT. No adnexal masses. No adnexal tenderness. Pelvis not fixed.  Breast Exam: exam not performed   HENT:     Head: Normocephalic and atraumatic.  Neck:     Thyroid: No thyromegaly.  Pulmonary:     Effort: Pulmonary effort is normal.  Abdominal:     General: Bowel sounds are normal. There is no distension.     Palpations: Abdomen is soft. There is no mass.     Comments: Incisions are well healed, clean dry and intact  Neurological:     Mental Status: She is alert and oriented to person, place, and time.  Skin:    General: Skin is warm and dry.  Psychiatric:        Behavior: Behavior normal.        Thought Content: Thought content normal.        Judgment: Judgment normal.  Vitals reviewed.    Assessment: s/p :  [facility] Robot-assisted Laparoscopic Hysterectomy and Bilateral Salpingectomy  stable  Plan:  Patient has done well after surgery with no apparent complications.    I have discussed the post-operative course to date, and the expected progress moving forward.  The patient understands what complications to be concerned about.  I will see the patient in routine follow up, or sooner if needed.     Activity plan: No restriction.     Adrian Prows MD, [facility], [facility] 11/05/2021 10:49 AM

## 2021-11-06 ENCOUNTER — Ambulatory Visit: Payer: BC Managed Care – PPO | Admitting: Dermatology

## 2021-11-06 ENCOUNTER — Encounter: Payer: Self-pay | Admitting: Dermatology

## 2021-11-06 DIAGNOSIS — D225 Melanocytic nevi of trunk: Secondary | ICD-10-CM

## 2021-11-06 DIAGNOSIS — D485 Neoplasm of uncertain behavior of skin: Secondary | ICD-10-CM

## 2021-11-06 MED ORDER — MUPIROCIN 2 % EX OINT: 1.0000 "application " | TOPICAL_OINTMENT | Freq: Every day | CUTANEOUS | 0 refills | Status: DC

## 2021-11-06 NOTE — Patient Instructions (Signed)

## 2021-11-06 NOTE — Progress Notes (Signed)
° °  Follow-Up Visit   Subjective  Katie Cross is a 40 y.o. female who presents for the following: Procedure (Patient here today for excision of bx proven severe dysplastic nevus at left upper back.).   The following portions of the chart were reviewed this encounter and updated as appropriate:   Tobacco   Allergies   Meds   Problems   Med Hx   Surg Hx   Fam Hx       Review of Systems:  No other skin or systemic complaints except as noted in HPI or Assessment and Plan.  Objective  Well appearing patient in no apparent distress; mood and affect are within normal limits.  A focused examination was performed including back. Relevant physical exam findings are noted in the Assessment and Plan.  Left Upper Back Healing biopsy site   Assessment & Plan  Neoplasm of uncertain behavior of skin Left Upper Back  Skin excision  Lesion length (cm):  0.9 Total excision diameter (cm):  1.9 Informed consent: discussed and consent obtained   Timeout: patient name, date of birth, surgical site, and procedure verified   Procedure prep:  Patient was prepped and draped in usual sterile fashion Prep type:  Chlorhexidine Anesthesia: the lesion was anesthetized in a standard fashion   Anesthetic:  1% lidocaine w/ epinephrine 1-100,000 buffered w/ 8.4% NaHCO3 (3cc lido w/epi, 9cc 0.25% bupivicaine) Instrument used: #10 blade   Hemostasis achieved with: suture, pressure and electrodesiccation   Outcome: patient tolerated procedure well with no complications   Post-procedure details: wound care instructions given   Additional details:  Mupirocin and a pressure dressing applied  Skin repair Complexity:  Intermediate Final length (cm):  5 Informed consent: discussed and consent obtained   Timeout: patient name, date of birth, surgical site, and procedure verified   Procedure prep:  Patient was prepped and draped in usual sterile fashion Prep type:  Chlorhexidine Anesthesia: the lesion was  anesthetized in a standard fashion   Anesthetic:  1% lidocaine w/ epinephrine 1-100,000 local infiltration Reason for type of repair: reduce tension to allow closure, reduce the risk of dehiscence, infection, and necrosis, reduce subcutaneous dead space and avoid a hematoma, allow closure of the large defect, preserve normal anatomy, preserve normal anatomical and functional relationships and enhance both functionality and cosmetic results   Undermining: edges undermined   Subcutaneous layers (deep stitches):  Suture size:  3-0 Suture type: PDS (polydioxanone)   Stitches:  Buried vertical mattress Fine/surface layer approximation (top stitches):  Suture size:  4-0 Suture type: Prolene (polypropylene)   Suture removal (days):  7 Hemostasis achieved with: suture, pressure and electrodesiccation Outcome: patient tolerated procedure well with no complications   Post-procedure details: wound care instructions given   Additional details:  Mupirocin and a pressure bandage applied   mupirocin ointment (BACTROBAN) 2 % Apply 1 application topically daily. With dressing changes  Specimen 1 - Surgical pathology Differential Diagnosis: DYSPLASTIC JUNCTIONAL NEVUS WITH SEVERE ATYPIA  Check Margins: No Healing biopsy site Tagged at superior tip [phone]    Return in about 1 week (around 11/13/2021) for Suture Removal.  Graciella Belton, RMA, am acting as scribe for Forest Gleason, MD .  Documentation: I have reviewed the above documentation for accuracy and completeness, and I agree with the above.  Forest Gleason, MD

## 2021-11-07 ENCOUNTER — Telehealth: Payer: Self-pay

## 2021-11-07 NOTE — Telephone Encounter (Signed)
Patient doing well after yesterday's surgery.Katie Cross

## 2021-11-09 ENCOUNTER — Ambulatory Visit
Admission: RE | Admit: 2021-11-09 | Discharge: 2021-11-09 | Disposition: A | Discharge status: Home / Self Care | Payer: BC Managed Care – PPO | Source: Ambulatory Visit | Attending: Obstetrics and Gynecology | Admitting: Obstetrics and Gynecology

## 2021-11-09 ENCOUNTER — Other Ambulatory Visit: Payer: Self-pay

## 2021-11-09 ENCOUNTER — Other Ambulatory Visit: Payer: Self-pay | Admitting: Obstetrics and Gynecology

## 2021-11-09 DIAGNOSIS — N6310 Unspecified lump in the right breast, unspecified quadrant: Secondary | ICD-10-CM

## 2021-11-09 DIAGNOSIS — Z9189 Other specified personal risk factors, not elsewhere classified: Secondary | ICD-10-CM | POA: Insufficient documentation

## 2021-11-09 MED ORDER — GADOBUTROL 1 MMOL/ML IV SOLN
10.0000 mL | Freq: Once | INTRAVENOUS | Status: AC | PRN
  Administered 2021-11-09: 10 mL via INTRAVENOUS

## 2021-11-09 NOTE — Progress Notes (Signed)
Breast US ordered per radiology

## 2021-11-12 ENCOUNTER — Encounter: Payer: Self-pay | Admitting: Gastroenterology

## 2021-11-12 ENCOUNTER — Ambulatory Visit
Admission: RE | Admit: 2021-11-12 | Discharge: 2021-11-12 | Disposition: A | Discharge status: Home / Self Care | Payer: BC Managed Care – PPO | Source: Ambulatory Visit | Attending: Gastroenterology | Admitting: Gastroenterology

## 2021-11-12 ENCOUNTER — Ambulatory Visit: Payer: BC Managed Care – PPO | Admitting: Anesthesiology

## 2021-11-12 ENCOUNTER — Other Ambulatory Visit: Payer: Self-pay

## 2021-11-12 ENCOUNTER — Encounter
Admission: RE | Disposition: A | Discharge status: Home / Self Care | Payer: Self-pay | Source: Ambulatory Visit | Attending: Gastroenterology

## 2021-11-12 DIAGNOSIS — Z6835 Body mass index (BMI) 35.0-35.9, adult: Secondary | ICD-10-CM | POA: Insufficient documentation

## 2021-11-12 DIAGNOSIS — D123 Benign neoplasm of transverse colon: Secondary | ICD-10-CM | POA: Diagnosis not present

## 2021-11-12 DIAGNOSIS — K621 Rectal polyp: Secondary | ICD-10-CM | POA: Diagnosis not present

## 2021-11-12 DIAGNOSIS — K635 Polyp of colon: Secondary | ICD-10-CM | POA: Diagnosis not present

## 2021-11-12 DIAGNOSIS — Z1211 Encounter for screening for malignant neoplasm of colon: Secondary | ICD-10-CM | POA: Diagnosis present

## 2021-11-12 DIAGNOSIS — Z800 Family history of malignant neoplasm of digestive organs: Secondary | ICD-10-CM

## 2021-11-12 DIAGNOSIS — Z860100 Personal history of colon polyps, unspecified: Secondary | ICD-10-CM

## 2021-11-12 HISTORY — PX: COLONOSCOPY WITH PROPOFOL: SHX5780

## 2021-11-12 HISTORY — PX: POLYPECTOMY: SHX5525

## 2021-11-12 SURGERY — COLONOSCOPY WITH PROPOFOL
Anesthesia: General | Site: Rectum

## 2021-11-12 MED ORDER — LIDOCAINE HCL (CARDIAC) PF 100 MG/5ML IV SOSY
PREFILLED_SYRINGE | INTRAVENOUS | Status: DC | PRN
Start: 2021-11-12 — End: 2021-11-12
  Administered 2021-11-12: 40 mg via INTRAVENOUS

## 2021-11-12 MED ORDER — STERILE WATER FOR IRRIGATION IR SOLN
Status: DC | PRN
  Administered 2021-11-12: 1

## 2021-11-12 MED ORDER — OXYCODONE HCL 5 MG PO TABS: 5.0000 mg | ORAL_TABLET | Freq: Once | ORAL | Status: DC | PRN

## 2021-11-12 MED ORDER — LACTATED RINGERS IV SOLN: INTRAVENOUS | Status: DC

## 2021-11-12 MED ORDER — SODIUM CHLORIDE 0.9 % IV SOLN: INTRAVENOUS | Status: DC

## 2021-11-12 MED ORDER — OXYCODONE HCL 5 MG/5ML PO SOLN: 5.0000 mg | Freq: Once | ORAL | Status: DC | PRN

## 2021-11-12 MED ORDER — PROPOFOL 10 MG/ML IV BOLUS
INTRAVENOUS | Status: DC | PRN
  Administered 2021-11-12: 70 mg via INTRAVENOUS
  Administered 2021-11-12: 50 mg via INTRAVENOUS
  Administered 2021-11-12 (×2): 70 mg via INTRAVENOUS
  Administered 2021-11-12: 50 mg via INTRAVENOUS
  Administered 2021-11-12: 70 mg via INTRAVENOUS

## 2021-11-12 SURGICAL SUPPLY — 8 items
GOWN CVR UNV OPN BCK APRN NK (MISCELLANEOUS) ×4 IMPLANT
GOWN ISOL THUMB LOOP REG UNIV (MISCELLANEOUS) ×6
KIT PRC NS LF DISP ENDO (KITS) ×2 IMPLANT
KIT PROCEDURE OLYMPUS (KITS) ×3
MANIFOLD NEPTUNE II (INSTRUMENTS) ×4 IMPLANT
SNARE COLD EXACTO (MISCELLANEOUS) ×2 IMPLANT
TRAP ETRAP POLY (MISCELLANEOUS) ×2 IMPLANT
WATER STERILE IRR 250ML POUR (IV SOLUTION) ×4 IMPLANT

## 2021-11-12 NOTE — Op Note (Signed)
[facility] Gastroenterology Patient Name: Katie Cross Procedure Date: 11/12/2021 9:52 AM MRN: [id number] Account #: [id number] Date of Birth: 28-Feb-1981 Admit Type: Outpatient Age: 40 Room: [facility] OR ROOM 01 Gender: Female Note Status: Finalized Instrument Name: [id number] Procedure:             Colonoscopy Indications:           High risk colon cancer surveillance: Personal history                         of colonic polyps Providers:             Lucilla Lame MD, MD Referring MD:          Sofie Hartigan (Referring MD) Medicines:             Propofol per Anesthesia Complications:         No immediate complications. Procedure:             Pre-Anesthesia Assessment:                        - Prior to the procedure, a History and Physical was                         performed, and patient medications and allergies were                         reviewed. The patient's tolerance of previous                         anesthesia was also reviewed. The risks and benefits                         of the procedure and the sedation options and risks                         were discussed with the patient. All questions were                         answered, and informed consent was obtained. Prior                         Anticoagulants: The patient has taken no previous                         anticoagulant or antiplatelet agents. ASA Grade                         Assessment: II - A patient with mild systemic disease.                         After reviewing the risks and benefits, the patient                         was deemed in satisfactory condition to undergo the                         procedure.  After obtaining informed consent, the colonoscope was                         passed under direct vision. Throughout the procedure,                         the patient's blood pressure, pulse, and oxygen                         saturations were monitored  continuously. The                         Colonoscope was introduced through the anus and                         advanced to the the cecum, identified by appendiceal                         orifice and ileocecal valve. The colonoscopy was                         performed without difficulty. The patient tolerated                         the procedure well. The quality of the bowel                         preparation was good. Findings:      The perianal and digital rectal examinations were normal.      Two sessile polyps were found in the transverse colon. The polyps were 4       to 6 mm in size. These polyps were removed with a cold snare. Resection       and retrieval were complete.      Three sessile polyps were found in the rectum. The polyps were 1 to 3 mm       in size. These polyps were removed with a cold snare. Resection and       retrieval were complete. Impression:            - Two 4 to 6 mm polyps in the transverse colon,                         removed with a cold snare. Resected and retrieved.                        - Three 1 to 3 mm polyps in the rectum, removed with a                         cold snare. Resected and retrieved. Recommendation:        - Discharge patient to home.                        - Resume previous diet.                        - Continue present medications.                        -  Await pathology results.                        - Repeat colonoscopy in 5 years for surveillance. Procedure Code(s):     --- Professional ---                        [phone], Colonoscopy, flexible; with removal of                         tumor(s), polyp(s), or other lesion(s) by snare                         technique Diagnosis Code(s):     --- Professional ---                        Z86.010, Personal history of colonic polyps                        K63.5, Polyp of colon                        K62.1, Rectal polyp CPT copyright 2019 [employer]. All rights  reserved. The codes documented in this report are preliminary and upon coder review may  be revised to meet current compliance requirements. Lucilla Lame MD, MD 11/12/2021 10:21:58 AM This report has been signed electronically. Number of Addenda: 0 Note Initiated On: 11/12/2021 9:52 AM Scope Withdrawal Time: 0 hours 9 minutes 41 seconds  Total Procedure Duration: 0 hours 17 minutes 26 seconds  Estimated Blood Loss:  Estimated blood loss: none.      [facility]

## 2021-11-12 NOTE — Anesthesia Postprocedure Evaluation (Signed)
Anesthesia Post Note  Patient: Katie Cross  Procedure(s) Performed: COLONOSCOPY WITH BIOPSY (Rectum) POLYPECTOMY (Rectum)     Patient location during evaluation: PACU Anesthesia Type: General Level of consciousness: awake and alert Pain management: pain level controlled Vital Signs Assessment: post-procedure vital signs reviewed and stable Respiratory status: spontaneous breathing, nonlabored ventilation, respiratory function stable and patient connected to nasal cannula oxygen Cardiovascular status: blood pressure returned to baseline and stable Postop Assessment: no apparent nausea or vomiting Anesthetic complications: no   No notable events documented.  Fidel Levy

## 2021-11-12 NOTE — Transfer of Care (Signed)
Immediate Anesthesia Transfer of Care Note  Patient: Katie Cross  Procedure(s) Performed: COLONOSCOPY WITH BIOPSY (Rectum) POLYPECTOMY (Rectum)  Patient Location: PACU  Anesthesia Type: General  Level of Consciousness: awake, alert  and patient cooperative  Airway and Oxygen Therapy: Patient Spontanous Breathing and Patient connected to supplemental oxygen  Post-op Assessment: Post-op Vital signs reviewed, Patient's Cardiovascular Status Stable, Respiratory Function Stable, Patent Airway and No signs of Nausea or vomiting  Post-op Vital Signs: Reviewed and stable  Complications: No notable events documented.

## 2021-11-12 NOTE — H&P (Signed)
Lucilla Lame, MD [address] [place], [place] [address] Phone:[phone] Fax : [phone]  Primary Care Physician:  Homero Fellers, MD Primary Gastroenterologist:  Dr. Allen Norris  Pre-Procedure History & Physical: HPI:  Katie Cross is a 40 y.o. female is here for an colonoscopy.   Past Medical History:  Diagnosis Date   Anemia    Appendicitis    Dysplastic nevus 08/07/2010   left med lower leg mild to moderate, left buttock moderate atypia   Dysplastic nevus 12/12/2015   right upper abdomen, halo nevus with mild atypia, limited margins free.   Dysplastic nevus 06/23/2018   left spinal lower back, moderate atypia, margins free.   Dysplastic nevus 10/24/2021   L upper back, severe dysplastic, excision scheduled   Dysplastic nevus 10/24/2021   L buttock, Mod dysplastic   Family history of breast cancer    pt declines MyRisk testing   Pelvic pain     Past Surgical History:  Procedure Laterality Date   APPENDECTOMY     CESAREAN SECTION     x3   CYSTOSCOPY N/A 09/13/2021   Procedure: CYSTOSCOPY;  Surgeon: Homero Fellers, MD;  Location: [facility];  Service: Gynecology;  Laterality: N/A;   DILATION AND EVACUATION     miscarriage   LAPAROSCOPY  2006   removed scar tissue/adhesions   PILONIDAL CYST EXCISION N/A 07/27/2021   Procedure: CYST EXCISION PILONIDAL EXTENSIVE;  Surgeon: Robert Bellow, MD;  Location: [facility];  Service: General;  Laterality: N/A;   ROBOTIC ASSISTED LAPAROSCOPIC HYSTERECTOMY AND SALPINGECTOMY Bilateral 09/13/2021   Procedure: XI ROBOTIC ASSISTED LAPAROSCOPIC TOTAL HYSTERECTOMY AND BILATERAL  SALPINGECTOMY;  Surgeon: Homero Fellers, MD;  Location: [facility];  Service: Gynecology;  Laterality: Bilateral;   TUBAL LIGATION      Prior to Admission medications   Medication Sig Start Date End Date Taking? Authorizing Provider  mupirocin ointment (BACTROBAN) 2 % Apply 1 application topically daily. With dressing  changes 11/06/21   Laurence Ferrari, [place], MD    Allergies as of 10/02/2021   (No Known Allergies)    Family History  Problem Relation Age of Onset   Hypertension Mother    Hyperlipidemia Mother    Hypertension Father    Hyperlipidemia Father    Breast cancer Maternal Aunt 20   Brain cancer Maternal Aunt    Breast cancer Paternal Aunt 88       BRCA neg   Colon cancer Paternal Aunt 31   Diabetes Mellitus II Paternal Grandfather     Social History   Socioeconomic History   Marital status: Married    Spouse name: Not on file   Number of children: Not on file   Years of education: Not on file   Highest education level: Not on file  Occupational History   Not on file  Tobacco Use   Smoking status: Never   Smokeless tobacco: Never  Vaping Use   Vaping Use: Never used  Substance and Sexual Activity   Alcohol use: No   Drug use: No   Sexual activity: Yes    Birth control/protection: Surgical    Comment: tubal  Other Topics Concern   Not on file  Social History Narrative   Not on file   Social Determinants of Health   Financial Resource Strain: Not on file  Food Insecurity: Not on file  Transportation Needs: Not on file  Physical Activity: Not on file  Stress: Not on file  Social Connections: Not on file  Intimate Partner Violence: Not on file    Review of Systems: See HPI, otherwise negative ROS  Physical Exam: BP (!) 151/82    Pulse 86    Temp 98.2 F (36.8 C) (Temporal)    Ht _0  (1.676 m)    Wt 99.7 kg    LMP 08/23/2021 (Approximate) Comment: U preg negative. Reviewed by Marya Amsler, RN   SpO2 98%    BMI 35.46 kg/m  General:   Alert,  pleasant and cooperative in NAD Head:  Normocephalic and atraumatic. Neck:  Supple; no masses or thyromegaly. Lungs:  Clear throughout to auscultation.    Heart:  Regular rate and rhythm. Abdomen:  Soft, nontender and nondistended. Normal bowel sounds, without guarding, and without rebound.   Neurologic:  Alert and  oriented  x4;  grossly normal neurologically.  Impression/Plan: Katie Cross is here for an colonoscopy to be performed for a history of adenomatous polyps on 2006   Risks, benefits, limitations, and alternatives regarding  colonoscopy have been reviewed with the patient.  Questions have been answered.  All parties agreeable.   Lucilla Lame, MD  11/12/2021, 9:54 AM

## 2021-11-12 NOTE — Anesthesia Preprocedure Evaluation (Signed)
Anesthesia Evaluation  Patient identified by MRN, date of birth, ID band Patient awake    Reviewed: NPO status   History of Anesthesia Complications Negative for: history of anesthetic complications  Airway Mallampati: II  TM Distance: >3 FB Neck ROM: full    Dental no notable dental hx.    Pulmonary neg pulmonary ROS, neg sleep apnea,    Pulmonary exam normal        Cardiovascular Exercise Tolerance: Good negative cardio ROS Normal cardiovascular exam     Neuro/Psych negative neurological ROS  negative psych ROS   GI/Hepatic negative GI ROS, Neg liver ROS,   Endo/Other  Morbid obesity (bmi 36)  Renal/GU negative Renal ROS  negative genitourinary   Musculoskeletal   Abdominal   Peds  Hematology negative hematology ROS (+)   Anesthesia Other Findings 08/2021: ROBOTIC ASSISTED LAPAROSCOPIC HYSTERECTOMY AND SALPINGECTOMY  Reproductive/Obstetrics hysterectomy                             Anesthesia Physical Anesthesia Plan  ASA: 2  Anesthesia Plan: General   Post-op Pain Management:    Induction:   PONV Risk Score and Plan: 3 and Propofol infusion, TIVA and Treatment may vary due to age or medical condition  Airway Management Planned:   Additional Equipment:   Intra-op Plan:   Post-operative Plan:   Informed Consent: I have reviewed the patients History and Physical, chart, labs and discussed the procedure including the risks, benefits and alternatives for the proposed anesthesia with the patient or authorized representative who has indicated his/her understanding and acceptance.       Plan Discussed with: CRNA  Anesthesia Plan Comments:         Anesthesia Quick Evaluation

## 2021-11-13 ENCOUNTER — Ambulatory Visit (INDEPENDENT_AMBULATORY_CARE_PROVIDER_SITE_OTHER): Payer: BC Managed Care – PPO | Admitting: Dermatology

## 2021-11-13 ENCOUNTER — Ambulatory Visit: Payer: BC Managed Care – PPO | Admitting: Dermatology

## 2021-11-13 ENCOUNTER — Encounter: Payer: Self-pay | Admitting: Gastroenterology

## 2021-11-13 DIAGNOSIS — D235 Other benign neoplasm of skin of trunk: Secondary | ICD-10-CM

## 2021-11-13 DIAGNOSIS — D239 Other benign neoplasm of skin, unspecified: Secondary | ICD-10-CM

## 2021-11-13 LAB — SURGICAL PATHOLOGY

## 2021-11-13 NOTE — Progress Notes (Signed)
° °  Follow-Up Visit   Subjective  Katie Cross is a 40 y.o. female who presents for the following: Follow-up (The patient presents for suture removal after surgery of severe dysplastic nevus of left upper back).  The following portions of the chart were reviewed this encounter and updated as appropriate:   Tobacco   Allergies   Meds   Problems   Med Hx   Surg Hx   Fam Hx      Review of Systems:  No other skin or systemic complaints except as noted in HPI or Assessment and Plan.  Objective  Well appearing patient in no apparent distress; mood and affect are within normal limits.  A focused examination was performed including back. Relevant physical exam findings are noted in the Assessment and Plan.  Left Upper Back Healing excision site   Assessment & Plan  Dysplastic nevus Left Upper Back  Biopsy proven NO RESIDUAL DYSPLASTIC NEVUS, MARGINS FREE   Encounter for Removal of Sutures - Incision site at the left upper back is clean, dry and intact - Wound cleansed, sutures removed, wound cleansed and steri strips applied.  - Discussed pathology results showing margins free  - Patient advised to keep steri-strips dry until they fall off. - Scars remodel for a full year. - Once steri-strips fall off, patient can apply over-the-counter silicone scar cream each night to help with scar remodeling if desired. - Patient advised to call with any concerns or if they notice any new or changing lesions.  Return for TBSE, as scheduled.  Graciella Belton, RMA, am acting as scribe for Sarina Ser, MD . Documentation: I have reviewed the above documentation for accuracy and completeness, and I agree with the above.  Sarina Ser, MD

## 2021-11-13 NOTE — Patient Instructions (Signed)

## 2021-11-22 ENCOUNTER — Ambulatory Visit
Admission: RE | Admit: 2021-11-22 | Discharge: 2021-11-22 | Disposition: A | Discharge status: Home / Self Care | Payer: BC Managed Care – PPO | Source: Ambulatory Visit | Attending: Obstetrics and Gynecology | Admitting: Obstetrics and Gynecology

## 2021-11-22 ENCOUNTER — Other Ambulatory Visit: Payer: Self-pay

## 2021-11-22 ENCOUNTER — Other Ambulatory Visit: Payer: Self-pay | Admitting: Obstetrics and Gynecology

## 2021-11-22 DIAGNOSIS — N6310 Unspecified lump in the right breast, unspecified quadrant: Secondary | ICD-10-CM | POA: Diagnosis present

## 2021-11-22 DIAGNOSIS — R928 Other abnormal and inconclusive findings on diagnostic imaging of breast: Secondary | ICD-10-CM

## 2021-11-25 ENCOUNTER — Encounter: Payer: Self-pay | Admitting: Dermatology

## 2021-11-30 ENCOUNTER — Ambulatory Visit
Admission: RE | Admit: 2021-11-30 | Discharge: 2021-11-30 | Disposition: A | Discharge status: Home / Self Care | Payer: BC Managed Care – PPO | Source: Ambulatory Visit | Attending: Obstetrics and Gynecology | Admitting: Obstetrics and Gynecology

## 2021-11-30 ENCOUNTER — Other Ambulatory Visit: Payer: Self-pay

## 2021-11-30 DIAGNOSIS — R928 Other abnormal and inconclusive findings on diagnostic imaging of breast: Secondary | ICD-10-CM | POA: Diagnosis not present

## 2021-11-30 DIAGNOSIS — N6310 Unspecified lump in the right breast, unspecified quadrant: Secondary | ICD-10-CM

## 2021-11-30 HISTORY — PX: BREAST BIOPSY: SHX20

## 2021-12-03 LAB — SURGICAL PATHOLOGY

## 2021-12-25 ENCOUNTER — Encounter: Payer: Self-pay | Admitting: Obstetrics and Gynecology

## 2021-12-26 NOTE — Telephone Encounter (Signed)
Do you want her to come in or start this with her regular dr when she finds one since you will be leaving.. please advise

## 2021-12-26 NOTE — Telephone Encounter (Signed)
We can do an in person or telephone visit for depression/anxiety.  You can let her know my last day is April 18th so she would need a different doctor for long term follow up.

## 2021-12-28 NOTE — Telephone Encounter (Signed)
Can you schedule a virtual visit please

## 2022-01-24 ENCOUNTER — Other Ambulatory Visit: Payer: Self-pay

## 2022-01-24 ENCOUNTER — Encounter: Payer: Self-pay | Admitting: Obstetrics and Gynecology

## 2022-01-24 ENCOUNTER — Ambulatory Visit (INDEPENDENT_AMBULATORY_CARE_PROVIDER_SITE_OTHER): Payer: BC Managed Care – PPO | Admitting: Obstetrics and Gynecology

## 2022-01-24 ENCOUNTER — Other Ambulatory Visit: Payer: Self-pay | Admitting: Obstetrics and Gynecology

## 2022-01-24 VITALS — Wt 219.0 lb

## 2022-01-24 DIAGNOSIS — F32A Depression, unspecified: Secondary | ICD-10-CM

## 2022-01-24 DIAGNOSIS — F419 Anxiety disorder, unspecified: Secondary | ICD-10-CM

## 2022-01-24 MED ORDER — FLUOXETINE HCL 20 MG PO CAPS
20.0000 mg | ORAL_CAPSULE | Freq: Every day | ORAL | 12 refills | Status: DC
Start: 2022-01-24 — End: 2022-02-15

## 2022-01-24 NOTE — Progress Notes (Unsigned)
Virtual Visit via Telephone Note ? ?I connected with NEALIE MCHATTON on 01/24/22 at  by telephone and verified that I am speaking with the correct person using two identifiers. ?  ?I discussed the limitations, risks, security and privacy concerns of performing an evaluation and management service by telephone and the availability of in person appointments. I also discussed with the patient that there may be a patient responsible charge related to this service. The patient expressed understanding and agreed to proceed. ? ?The patient was at home ?I spoke with the patient from my  office ?The names of people involved in this encounter were: *** , and *** , and *** .  ? ?History of Present Illness: ?*** ? ?GAD-7: 8 ?PHQ-9: 12 ?  ?Observations/Objective: ?  ?Physical Exam could not be performed. Because of the COVID-19 outbreak this visit was performed over the phone and not in person.  ? ?Assessment and Plan: ?*** ? ?Follow Up Instructions: ?*** ?  ?I discussed the assessment and treatment plan with the patient. The patient was provided an opportunity to ask questions and all were answered. The patient agreed with the plan and demonstrated an understanding of the instructions. ?  ?The patient was advised to call back or seek an in-person evaluation if the symptoms worsen or if the condition fails to improve as anticipated. ? ?I provided *** minutes of non-face-to-face time during this encounter. ? ?Adrian Prows MD ?[place], [place] ?01/24/2022 ?10:05 AM ? ? ?

## 2022-01-24 NOTE — Progress Notes (Signed)
Virtual Visit via Telephone Note ?  ?I connected with Katie Cross on 01/24/22 at  by telephone and verified that I am speaking with the correct person using two identifiers. ?  ?I discussed the limitations, risks, security and privacy concerns of performing an evaluation and management service by telephone and the availability of in person appointments. I also discussed with the patient that there may be a patient responsible charge related to this service. The patient expressed understanding and agreed to proceed. ?  ?The patient was at home ?I spoke with the patient from my  office ?The names of people involved in this encounter were: Dr. Gilman Schmidt and Susette Racer. ?  ?History of Present Illness: ?She reports that she has been feeling recently that she is stuck in a rut.  She has 3 children at home who are all in their teens. She feels like it is always been her nature to be stressed or anxious.  Her children have been treated for anxiety and depression and she feels like through speaking with them and speak with therapist she feels like she is seeing some the same symptoms of depression anxiety and herself.  And have done well with being treated with Prozac and Zoloft and she is interested in seeing if these medications can help her some. ?  ?GAD-7: 8 ?PHQ-9: 12 ?  ?Observations/Objective: ?  ?Physical Exam could not be performed. Because of the COVID-19 outbreak this visit was performed over the phone and not in person.  ?  ?Assessment and Plan: ?41 year old with generalized anxiety and depression. ?Prescription for Prozac sent to initiate. ?We will plan to follow-up in 4 to 6 weeks. ?Discussed local mental health resources to follow-up with after I leave this job. ?  ?Follow Up Instructions: ?4 to 6 weeks ?  ?I discussed the assessment and treatment plan with the patient. The patient was provided an opportunity to ask questions and all were answered. The patient agreed with the plan and demonstrated an  understanding of the instructions. ?  ?The patient was advised to call back or seek an in-person evaluation if the symptoms worsen or if the condition fails to improve as anticipated. ?  ?I provided 10 minutes of non-face-to-face time during this encounter. ?  ?Adrian Prows MD ?[place], [place] ?01/24/2022 ?10:05 AM ?

## 2022-01-24 NOTE — Patient Instructions (Signed)
Managing Anxiety, Adult After being diagnosed with anxiety, you may be relieved to know why you have felt or behaved a certain way. You may also feel overwhelmed about the treatment ahead and what it will mean for your life. With care and support, you can manage this condition. How to manage lifestyle changes Managing stress and anxiety Stress is your body's reaction to life changes and events, both good and bad. Most stress will last just a few hours, but stress can be ongoing and can lead to more than just stress. Although stress can play a major role in anxiety, it is not the same as anxiety. Stress is usually caused by something external, such as a deadline, test, or competition. Stress normally passes after the triggering event has ended.  Anxiety is caused by something internal, such as imagining a terrible outcome or worrying that something will go wrong that will devastate you. Anxiety often does not go away even after the triggering event is over, and it can become long-term (chronic) worry. It is important to understand the differences between stress and anxiety and to manage your stress effectively so that it does not lead to an anxious response. Talk with your health care provider or a counselor to learn more about reducing anxiety and stress. He or she may suggest tension reduction techniques, such as: Music therapy. Spend time creating or listening to music that you enjoy and that inspires you. Mindfulness-based meditation. Practice being aware of your normal breaths while not trying to control your breathing. It can be done while sitting or walking. Centering prayer. This involves focusing on a word, phrase, or sacred image that means something to you and brings you peace. Deep breathing. To do this, expand your stomach and inhale slowly through your nose. Hold your breath for 3-5 seconds. Then exhale slowly, letting your stomach muscles relax. Self-talk. Learn to notice and identify  thought patterns that lead to anxiety reactions and change those patterns to thoughts that feel peaceful. Muscle relaxation. Taking time to tense muscles and then relax them. Choose a tension reduction technique that fits your lifestyle and personality. These techniques take time and practice. Set aside 5-15 minutes a day to do them. Therapists can offer counseling and training in these techniques. The training to help with anxiety may be covered by some insurance plans. Other things you can do to manage stress and anxiety include: Keeping a stress diary. This can help you learn what triggers your reaction and then learn ways to manage your response. Thinking about how you react to certain situations. You may not be able to control everything, but you can control your response. Making time for activities that help you relax and not feeling guilty about spending your time in this way. Doing visual imagery. This involves imagining or creating mental pictures to help you relax. Practicing yoga. Through yoga poses, you can lower tension and promote relaxation.  Medicines Medicines can help ease symptoms. Medicines for anxiety include: Antidepressant medicines. These are usually prescribed for long-term daily control. Anti-anxiety medicines. These may be added in severe cases, especially when panic attacks occur. Medicines will be prescribed by a health care provider. When used together, medicines, psychotherapy, and tension reduction techniques may be the most effective treatment. Relationships Relationships can play a big part in helping you recover. Try to spend more time connecting with trusted friends and family members. Consider going to couples counseling if you have a partner, taking family education classes, or going to family  therapy. Therapy can help you and others better understand your condition. How to recognize changes in your anxiety Everyone responds differently to treatment for  anxiety. Recovery from anxiety happens when symptoms decrease and stop interfering with your daily activities at home or work. This may mean that you will start to: Have better concentration and focus. Worry will interfere less in your daily thinking. Sleep better. Be less irritable. Have more energy. Have improved memory. It is also important to recognize when your condition is getting worse. Contact your health care provider if your symptoms interfere with home or work and you feel like your condition is not improving. Follow these instructions at home: Activity Exercise. Adults should do the following: Exercise for at least 150 minutes each week. The exercise should increase your heart rate and make you sweat (moderate-intensity exercise). Strengthening exercises at least twice a week. Get the right amount and quality of sleep. Most adults need 7-9 hours of sleep each night. Lifestyle  Eat a healthy diet that includes plenty of vegetables, fruits, whole grains, low-fat dairy products, and lean protein. Do not eat a lot of foods that are high in fats, added sugars, or salt (sodium). Make choices that simplify your life. Do not use any products that contain nicotine or tobacco. These products include cigarettes, chewing tobacco, and vaping devices, such as e-cigarettes. If you need help quitting, ask your health care provider. Avoid caffeine, alcohol, and certain over-the-counter cold medicines. These may make you feel worse. Ask your pharmacist which medicines to avoid. General instructions Take over-the-counter and prescription medicines only as told by your health care provider. Keep all follow-up visits. This is important. Where to find support You can get help and support from these sources: Self-help groups. Online and [employer]. A trusted spiritual leader. Couples counseling. Family education classes. Family therapy. Where to find more information You may find  that joining a support group helps you deal with your anxiety. The following sources can help you locate counselors or support groups near you: [place]: www.mentalhealthamerica.net Anxiety and Depression Association of [place] (ADAA): https://www.clark.net/ National Alliance on Mental Illness (NAMI): www.nami.org Contact a health care provider if: You have a hard time staying focused or finishing daily tasks. You spend many hours a day feeling worried about everyday life. You become exhausted by worry. You start to have headaches or frequently feel tense. You develop chronic nausea or diarrhea. Get help right away if: You have a racing heart and shortness of breath. You have thoughts of hurting yourself or others. If you ever feel like you may hurt yourself or others, or have thoughts about taking your own life, get help right away. Go to your nearest emergency department or: Call your local emergency services (911 in the [place] Call a suicide crisis helpline, such as the [place] at (567)351-2375 or 988 in the [place]. This is open 24 hours a day in the [place] Text the Crisis Text Line at 208-517-4183 (in the [place].). Summary Taking steps to learn and use tension reduction techniques can help calm you and help prevent triggering an anxiety reaction. When used together, medicines, psychotherapy, and tension reduction techniques may be the most effective treatment. Family, friends, and partners can play a big part in supporting you. This information is not intended to replace advice given to you by your health care provider. Make sure you discuss any questions you have with your health care provider. Document Revised: 06/06/2021 Document Reviewed: 03/04/2021 Elsevier Patient  Education  2022 [place]. Managing Depression, Adult Depression is a mental health condition that affects your thoughts, feelings, and actions. Being diagnosed with depression can bring you relief  if you did not know why you have felt or behaved a certain way. It could also leave you feeling overwhelmed with uncertainty about your future. Preparing yourself to manage your symptoms can help you feel more positive about your future. How to manage lifestyle changes Managing stress Stress is your body's reaction to life changes and events, both good and bad. Stress can add to your feelings of depression. Learning to manage your stress can help lessen your feelings of depression. Try some of the following approaches to reducing your stress (stress reduction techniques): Listen to music that you enjoy and that inspires you. Try using a meditation app or take a meditation class. Develop a practice that helps you connect with your spiritual self. Walk in nature, pray, or go to a place of worship. Do some deep breathing. To do this, inhale slowly through your nose. Pause at the top of your inhale for a few seconds and then exhale slowly, letting your muscles relax. Practice yoga to help relax and work your muscles. Choose a stress reduction technique that suits your lifestyle and personality. These techniques take time and practice to develop. Set aside 5-15 minutes a day to do them. Therapists can offer training in these techniques. Other things you can do to manage stress include: Keeping a stress diary. Knowing your limits and saying no when you think something is too much. Paying attention to how you react to certain situations. You may not be able to control everything, but you can change your reaction. Adding humor to your life by watching funny films or TV shows. Making time for activities that you enjoy and that relax you.  Medicines Medicines, such as antidepressants, are often a part of treatment for depression. Talk with your pharmacist or health care provider about all the medicines, supplements, and herbal products that you take, their possible side effects, and what medicines and other  products are safe to take together. Make sure to report any side effects you may have to your health care provider. Relationships Your health care provider may suggest family therapy, couples therapy, or individual therapy as part of your treatment. How to recognize changes Everyone responds differently to treatment for depression. As you recover from depression, you may start to: Have more interest in doing activities. Feel less hopeless. Have more energy. Overeat less often, or have a better appetite. Have better mental focus. It is important to recognize if your depression is not getting better or is getting worse. The symptoms you had in the beginning may return, such as: Tiredness (fatigue) or low energy. Eating too much or too little. Sleeping too much or too little. Feeling restless, agitated, or hopeless. Trouble focusing or making decisions. Unexplained physical complaints. Feeling irritable, angry, or aggressive. If you or your family members notice these symptoms coming back, let your health care provider know right away. Follow these instructions at home: Activity  Try to get some form of exercise each day, such as walking, biking, swimming, or lifting weights. Practice stress reduction techniques. Engage your mind by taking a class or doing some volunteer work. Lifestyle Get the right amount and quality of sleep. Cut down on using caffeine, tobacco, alcohol, and other potentially harmful substances. Eat a healthy diet that includes plenty of vegetables, fruits, whole grains, low-fat dairy products, and lean  protein. Do not eat a lot of foods that are high in solid fats, added sugars, or salt (sodium). General instructions Take over-the-counter and prescription medicines only as told by your health care provider. Keep all follow-up visits as told by your health care provider. This is important. Where to find support Talking to others Friends and family members can be  sources of support and guidance. Talk to trusted friends or family members about your condition. Explain your symptoms to them, and let them know that you are working with a health care provider to treat your depression. Tell friends and family members how they also can be helpful. Finances Find appropriate mental health providers that fit with your financial situation. Talk with your health care provider about options to get reduced prices on your medicines. Where to find more information You can find support in your area from: Anxiety and Depression Association of America (ADAA): www.adaa.org Mental Health America: www.mentalhealthamerica.net [employer] on Mental Illness: www.nami.org Contact a health care provider if: You stop taking your antidepressant medicines, and you have any of these symptoms: Nausea. Headache. Light-headedness. Chills and body aches. Not being able to sleep (insomnia). You or your friends and family think your depression is getting worse. Get help right away if: You have thoughts of hurting yourself or others. If you ever feel like you may hurt yourself or others, or have thoughts about taking your own life, get help right away. Go to your nearest emergency department or: Call your local emergency services (911 in the [place] Call a suicide crisis helpline, such as the [place] at 872-684-7497 or 988 in the [place]. This is open 24 hours a day in the [place] Text the Crisis Text Line at (239) 179-1560 (in the [place].). Summary If you are diagnosed with depression, preparing yourself to manage your symptoms is a good way to feel positive about your future. Work with your health care provider on a management plan that includes stress reduction techniques, medicines (if applicable), therapy, and healthy lifestyle habits. Keep talking with your health care provider about how your treatment is working. If you have thoughts about taking your own life,  call a suicide crisis helpline or text a crisis text line. This information is not intended to replace advice given to you by your health care provider. Make sure you discuss any questions you have with your health care provider. Document Revised: 06/06/2021 Document Reviewed: 09/22/2019 Elsevier Patient Education  2022 [employer].

## 2022-02-15 ENCOUNTER — Other Ambulatory Visit: Payer: Self-pay | Admitting: Obstetrics and Gynecology

## 2022-02-15 DIAGNOSIS — F419 Anxiety disorder, unspecified: Secondary | ICD-10-CM

## 2022-02-25 ENCOUNTER — Ambulatory Visit (INDEPENDENT_AMBULATORY_CARE_PROVIDER_SITE_OTHER): Payer: BC Managed Care – PPO | Admitting: Obstetrics and Gynecology

## 2022-02-25 DIAGNOSIS — Z9189 Other specified personal risk factors, not elsewhere classified: Secondary | ICD-10-CM

## 2022-02-25 NOTE — Progress Notes (Signed)
Virtual Visit via Telephone Note ? ?I connected with Katie Cross on 02/25/22 at 10:55 AM EDT by telephone and verified that I am speaking with the correct person using two identifiers. ?  ?I discussed the limitations, risks, security and privacy concerns of performing an evaluation and management service by telephone and the availability of in person appointments. I also discussed with the patient that there may be a patient responsible charge related to this service. The patient expressed understanding and agreed to proceed. ? ?The patient was at home ?I spoke with the patient from my office ?The names of people involved in this encounter were: Susette Racer and Dr. Gilman Schmidt. ? ?History of Present Illness: ?She reports that with taking the Prozac she has noticed improvement in her mood overall.  She reports that she has had a good last month.  She would like to continue with the medication.  She is happy at the current dose. ? ?She had  a breast biopsy in January and was advised for 52-monthMRI follow-up. ?  ?Observations/Objective: ? ?Physical Exam could not be performed. Because of the COVID-19 outbreak this visit was performed over the phone and not in person.  ? ?Assessment and Plan: ?41year old with anxiety depression improved with Prozac.  Patient has refills to continue this medication.  Discussed follow-up with PCP or new GYN physician, ?Ordered breast MRI for June 2023. ? ?Follow Up Instructions: ?As needed or for annual exam ?  ?I discussed the assessment and treatment plan with the patient. The patient was provided an opportunity to ask questions and all were answered. The patient agreed with the plan and demonstrated an understanding of the instructions. ?  ?The patient was advised to call back or seek an in-person evaluation if the symptoms worsen or if the condition fails to improve as anticipated. ? ?I provided 5 minutes of non-face-to-face time during this encounter. ? ?CAdrian Prows MD ?[facility] [place]?02/25/2022 ?11:02 AM ? ? ?

## 2022-03-11 ENCOUNTER — Other Ambulatory Visit: Payer: Self-pay | Admitting: Obstetrics and Gynecology

## 2022-03-11 DIAGNOSIS — Z1231 Encounter for screening mammogram for malignant neoplasm of breast: Secondary | ICD-10-CM

## 2022-04-10 ENCOUNTER — Other Ambulatory Visit: Payer: Self-pay | Admitting: Obstetrics and Gynecology

## 2022-04-15 ENCOUNTER — Other Ambulatory Visit: Payer: Self-pay | Admitting: Obstetrics and Gynecology

## 2022-04-15 DIAGNOSIS — Z9189 Other specified personal risk factors, not elsewhere classified: Secondary | ICD-10-CM

## 2022-04-15 DIAGNOSIS — N6310 Unspecified lump in the right breast, unspecified quadrant: Secondary | ICD-10-CM

## 2022-04-15 NOTE — Progress Notes (Signed)
Breast MRI order for f/u imaging 6 months after 1/23 neg bx. Had cat 4 breast MRI 12/22.

## 2022-04-25 ENCOUNTER — Ambulatory Visit
Admission: RE | Admit: 2022-04-25 | Discharge: 2022-04-25 | Disposition: A | Discharge status: Home / Self Care | Payer: BC Managed Care – PPO | Source: Ambulatory Visit | Attending: Obstetrics and Gynecology | Admitting: Obstetrics and Gynecology

## 2022-04-25 DIAGNOSIS — Z1231 Encounter for screening mammogram for malignant neoplasm of breast: Secondary | ICD-10-CM | POA: Insufficient documentation

## 2022-05-20 NOTE — Progress Notes (Signed)
BP 136/81   Pulse 69   Temp 98.9 F (37.2 C) (Oral)   Ht 5\' 6"  (1.676 m)   Wt 228 lb 12.8 oz (103.8 kg)   LMP 08/07/2021   SpO2 99%   BMI 36.93 kg/m    Subjective:    Patient ID: Katie Cross, female    DOB: January 27, 1981, 41 y.o.   MRN: [id number]  HPI: Katie Cross is a 41 y.o. female  Chief Complaint  Patient presents with   Establish Care   Patient presents to clinic to establish care with new PCP.  Introduced to Publishing rights manager role and practice setting.  All questions answered.  Discussed provider/patient relationship and expectations.  Patient reports a history of sleep apnea, depression and anxiety.  Patient states she hasn't seen a PCP.  She was previously seeing her OBGYN who was prescribing her medication. She started taking the Prozac back in April and finds it very helpful.     Patient denies a history of: Hypertension, Elevated Cholesterol, Diabetes, Thyroid problems, Neurological problems, and Abdominal problems.      05/21/2022   10:06 AM 02/25/2022   11:01 AM  GAD 7 : Generalized Anxiety Score  Nervous, Anxious, on Edge 0 1  Control/stop worrying 0 1  Worry too much - different things 0 1  Trouble relaxing 0 1  Restless 0 0  Easily annoyed or irritable 0 1  Afraid - awful might happen 0 0  Total GAD 7 Score 0 5  Anxiety Difficulty Not difficult at all     [facility] Office Visit from 05/21/2022 in [facility]  PHQ-9 Total Score 2        Active Ambulatory Problems    Diagnosis Date Noted   Well woman exam with routine gynecological exam 09/11/2020   At increased risk of breast cancer 02/14/2021   Acute midline low back pain without sciatica 06/26/2018   Cholelithiasis 07/17/2018   Chronic tension-type headache, not intractable 06/26/2018   Hepatic lesion 06/27/2018   Varicose veins of leg with pain, 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   Mass of chest wall 10/27/2019   Anemia 03/27/2021   Elevated BP without diagnosis of hypertension  03/28/2021   Abnormal uterine bleeding    Menorrhagia with regular cycle    Personal history of colonic polyps    Polyp of transverse colon    Rectal polyp    Anxiety    Resolved Ambulatory Problems    Diagnosis Date Noted   No Resolved Ambulatory Problems   Past Medical History:  Diagnosis Date   Appendicitis    Dysplastic nevus 08/07/2010   Dysplastic nevus 12/12/2015   Dysplastic nevus 06/23/2018   Dysplastic nevus 10/24/2021   Dysplastic nevus 10/24/2021   Family history of breast cancer    Pelvic pain    Past Surgical History:  Procedure Laterality Date   APPENDECTOMY     BREAST BIOPSY Right 11/30/2021   [place] Bx, Venus Clip, path pending   CESAREAN SECTION     x3   COLONOSCOPY WITH PROPOFOL N/A 11/12/2021   Procedure: COLONOSCOPY WITH BIOPSY;  Surgeon: Midge Minium, MD;  Location: [facility];  Service: Endoscopy;  Laterality: N/A;   CYSTOSCOPY N/A 09/13/2021   Procedure: CYSTOSCOPY;  Surgeon: Natale Milch, MD;  Location: [facility];  Service: Gynecology;  Laterality: N/A;   DILATION AND EVACUATION     miscarriage   LAPAROSCOPY  2006   removed scar tissue/adhesions   PILONIDAL CYST EXCISION N/A 07/27/2021  Procedure: CYST EXCISION PILONIDAL EXTENSIVE;  Surgeon: Earline Mayotte, MD;  Location: [facility];  Service: General;  Laterality: N/A;   POLYPECTOMY N/A 11/12/2021   Procedure: POLYPECTOMY;  Surgeon: Midge Minium, MD;  Location: [facility];  Service: Endoscopy;  Laterality: N/A;   ROBOTIC ASSISTED LAPAROSCOPIC HYSTERECTOMY AND SALPINGECTOMY Bilateral 09/13/2021   Procedure: XI ROBOTIC ASSISTED LAPAROSCOPIC TOTAL HYSTERECTOMY AND BILATERAL  SALPINGECTOMY;  Surgeon: Natale Milch, MD;  Location: [facility];  Service: Gynecology;  Laterality: Bilateral;   TUBAL LIGATION     Family History  Problem Relation Age of Onset   Hypertension Mother    Hyperlipidemia Mother    Hypertension Father    Hyperlipidemia Father    Breast cancer  Maternal Aunt 56   Brain cancer Maternal Aunt    Breast cancer Paternal Aunt 34       BRCA neg   Colon cancer Paternal Aunt 78   Diabetes Mellitus II Paternal Grandfather      Review of Systems  Psychiatric/Behavioral:  Positive for dysphoric mood. The patient is nervous/anxious.     Per HPI unless specifically indicated above     Objective:    BP 136/81   Pulse 69   Temp 98.9 F (37.2 C) (Oral)   Ht 5\' 6"  (1.676 m)   Wt 228 lb 12.8 oz (103.8 kg)   LMP 08/07/2021   SpO2 99%   BMI 36.93 kg/m   Wt Readings from Last 3 Encounters:  05/21/22 228 lb 12.8 oz (103.8 kg)  01/24/22 219 lb (99.3 kg)  11/12/21 219 lb 11.2 oz (99.7 kg)    Physical Exam Vitals and nursing note reviewed.  Constitutional:      General: She is not in acute distress.    Appearance: Normal appearance. She is normal weight. She is not ill-appearing, toxic-appearing or diaphoretic.  HENT:     Head: Normocephalic.     Right Ear: External ear normal.     Left Ear: External ear normal.     Nose: Nose normal.     Mouth/Throat:     Mouth: Mucous membranes are moist.     Pharynx: Oropharynx is clear.  Eyes:     General:        Right eye: No discharge.        Left eye: No discharge.     Extraocular Movements: Extraocular movements intact.     Conjunctiva/sclera: Conjunctivae normal.     Pupils: Pupils are equal, round, and reactive to light.  Cardiovascular:     Rate and Rhythm: Normal rate and regular rhythm.     Heart sounds: No murmur heard. Pulmonary:     Effort: Pulmonary effort is normal. No respiratory distress.     Breath sounds: Normal breath sounds. No wheezing or rales.  Musculoskeletal:     Cervical back: Normal range of motion and neck supple.  Skin:    General: Skin is warm and dry.     Capillary Refill: Capillary refill takes less than 2 seconds.  Neurological:     General: No focal deficit present.     Mental Status: She is alert and oriented to person, place, and time. Mental  status is at baseline.  Psychiatric:        Mood and Affect: Mood normal.        Behavior: Behavior normal.        Thought Content: Thought content normal.        Judgment: Judgment normal.  Results for orders placed or performed during the hospital encounter of 11/30/21  Surgical pathology  Result Value Ref Range   SURGICAL PATHOLOGY      SURGICAL PATHOLOGY CASE: [id number] PATIENT: Cathey Lack Surgical Pathology Report     Specimen Submitted: A. Breast, right  Clinical History: Venus clip at biopsy site within right breast mass.      DIAGNOSIS: A. RIGHT BREAST, 4:00 3CMFN; ULTRASOUND-GUIDED BIOPSY: - FIBROCYSTIC CHANGES WITH APOCRINE METAPLASIA. - DENSE STROMAL FIBROSIS. - NEGATIVE FOR ATYPIA AND MALIGNANCY.   GROSS DESCRIPTION: A. Labeled: Right breast 4:00 3 cm from nipple Received: Formalin Time/date in fixative: Collected and placed in formalin at 9:26 AM on 11/30/2021 Cold ischemic time: Less than 1 minute Total fixation time: Approximately 8 hours Core pieces: 4 cores Size: Range from 0.5-0.7 cm in length and 0.2 cm in diameter Description: Received are cores of pink-yellow fibrofatty tissue. Ink color: Blue Entirely submitted in cassettes 1-2 with 2 cores per cassette.  RB 11/30/2021  Final Diagnosis performed by Georgeanna Harrison, MD.   Electronically signed 12/03/2021 8:23:0 1AM The electronic signature indicates that the named Attending Pathologist has evaluated the specimen Technical component performed at [facility], [address] [place], [place] [address] Lab: [phone] Dir: Jolene Schimke, MD, MMM  Professional component performed at [facility], [address] [place], [place], [place] [address] Lab: [phone] Dir: Beryle Quant, MD       Assessment & Plan:   Problem List Items Addressed This Visit       Other   Anxiety    Chronic.  Controlled.  Continue with current medication regimen of Prozac daily .   Does not need refills at this time. Return to clinic in 6 months for reevaluation.  Call sooner if concerns arise.        Other Visit Diagnoses     Encounter to establish care    -  Primary        Follow up plan: Return in about 1 month (around 06/20/2022) for Physical and Fasting labs.

## 2022-05-21 ENCOUNTER — Ambulatory Visit: Payer: BC Managed Care – PPO | Admitting: Nurse Practitioner

## 2022-05-21 ENCOUNTER — Encounter: Payer: Self-pay | Admitting: Nurse Practitioner

## 2022-05-21 VITALS — BP 136/81 | HR 69 | Temp 98.9°F | Ht 66.0 in | Wt 228.8 lb

## 2022-05-21 DIAGNOSIS — Z7689 Persons encountering health services in other specified circumstances: Secondary | ICD-10-CM | POA: Diagnosis not present

## 2022-05-21 DIAGNOSIS — F419 Anxiety disorder, unspecified: Secondary | ICD-10-CM

## 2022-05-21 DIAGNOSIS — F32A Depression, unspecified: Secondary | ICD-10-CM | POA: Insufficient documentation

## 2022-05-21 NOTE — Assessment & Plan Note (Signed)
Chronic.  Controlled.  Continue with current medication regimen of Prozac daily .  Does not need refills at this time. Return to clinic in 6 months for reevaluation.  Call sooner if concerns arise.

## 2022-05-23 ENCOUNTER — Other Ambulatory Visit: Payer: Self-pay | Admitting: Obstetrics and Gynecology

## 2022-05-23 NOTE — Addendum Note (Signed)
Addended by: Ardeth Perfect B on: 9/56/2130 11:50 AM   Modules accepted: Orders

## 2022-06-26 ENCOUNTER — Encounter: Payer: BC Managed Care – PPO | Admitting: Nurse Practitioner

## 2022-07-31 NOTE — Progress Notes (Signed)
BP 122/82   Pulse 67   Temp 98.3 F (36.8 C) (Oral)   Ht 5' 7.72" (1.72 m)   Wt 229 lb 8 oz (104.1 kg)   LMP 08/07/2021   SpO2 97%   BMI 35.19 kg/m    Subjective:    Patient ID: Katie Cross, female    DOB: Jun 17, 1981, 41 y.o.   MRN: [id number]  HPI: Katie Cross is a 41 y.o. female presenting on 08/01/2022 for comprehensive medical examination. Current medical complaints include:none  She currently lives with: Menopausal Symptoms: no  Depression Screen done today and results listed below:     08/01/2022    9:56 AM 05/21/2022   10:06 AM 02/25/2022   10:58 AM  Depression screen PHQ 2/9  Decreased Interest 0 0 1  Down, Depressed, Hopeless 0 0 0  PHQ - 2 Score 0 0 1  Altered sleeping 0 1 0  Tired, decreased energy 0 1 0  Change in appetite 0 0 0  Feeling bad or failure about yourself  1 0 1  Trouble concentrating 1 0 1  Moving slowly or fidgety/restless 0 0 0  Suicidal thoughts 0 0 0  PHQ-9 Score 2 2 3   Difficult doing work/chores Somewhat difficult Not difficult at all Not difficult at all    The patient does not have a history of falls. I did complete a risk assessment for falls. A plan of care for falls was documented.   Past Medical History:  Past Medical History:  Diagnosis Date   Anemia    Anxiety    Appendicitis    Dysplastic nevus 08/07/2010   left med lower leg mild to moderate, left buttock moderate atypia   Dysplastic nevus 12/12/2015   right upper abdomen, halo nevus with mild atypia, limited margins free.   Dysplastic nevus 06/23/2018   left spinal lower back, moderate atypia, margins free.   Dysplastic nevus 10/24/2021   L upper back, severe dysplastic, excision scheduled   Dysplastic nevus 10/24/2021   L buttock, Mod dysplastic   Family history of breast cancer    pt declines MyRisk testing   Pelvic pain     Surgical History:  Past Surgical History:  Procedure Laterality Date   APPENDECTOMY     BREAST BIOPSY Right 11/30/2021   [place] Bx,  Ven[place] Clip, path pending   CESAREAN SECTION     x3   COLONOSCOPY WITH PROPOFOL N/A 11/12/2021   Procedure: COLONOSCOPY WITH BIOPSY;  Surgeon: Lucilla Lame, MD;  Location: [facility];  Service: Endoscopy;  Laterality: N/A;   CYSTOSCOPY N/A 09/13/2021   Procedure: CYSTOSCOPY;  Surgeon: Homero Fellers, MD;  Location: [facility];  Service: Gynecology;  Laterality: N/A;   DILATION AND EVACUATION     miscarriage   LAPAROSCOPY  2006   removed scar tissue/adhesions   PILONIDAL CYST EXCISION N/A 07/27/2021   Procedure: CYST EXCISION PILONIDAL EXTENSIVE;  Surgeon: Robert Bellow, MD;  Location: [facility];  Service: General;  Laterality: N/A;   POLYPECTOMY N/A 11/12/2021   Procedure: POLYPECTOMY;  Surgeon: Lucilla Lame, MD;  Location: [facility];  Service: Endoscopy;  Laterality: N/A;   ROBOTIC ASSISTED LAPAROSCOPIC HYSTERECTOMY AND SALPINGECTOMY Bilateral 09/13/2021   Procedure: XI ROBOTIC ASSISTED LAPAROSCOPIC TOTAL HYSTERECTOMY AND BILATERAL  SALPINGECTOMY;  Surgeon: Homero Fellers, MD;  Location: [facility];  Service: Gynecology;  Laterality: Bilateral;   TUBAL LIGATION      Medications:  Current Outpatient Medications on File Prior to Visit  Medication Sig   FLUoxetine (PROZAC) 20 MG capsule TAKE 1 CAPSULE BY MOUTH EVERY DAY   No current facility-administered medications on file prior to visit.    Allergies:  No Known Allergies  Social History:  Social History   Socioeconomic History   Marital status: Married    Spouse name: Not on file   Number of children: Not on file   Years of education: Not on file   Highest education level: Not on file  Occupational History   Not on file  Tobacco Use   Smoking status: Never   Smokeless tobacco: Never  Vaping Use   Vaping Use: Never used  Substance and Sexual Activity   Alcohol use: No   Drug use: No   Sexual activity: Yes    Birth control/protection: Surgical    Comment: tubal  Other Topics Concern    Not on file  Social History Narrative   Not on file   Social Determinants of Health   Financial Resource Strain: Not on file  Food Insecurity: Not on file  Transportation Needs: Not on file  Physical Activity: Not on file  Stress: Not on file  Social Connections: Not on file  Intimate Partner Violence: Not on file   Social History   Tobacco Use  Smoking Status Never  Smokeless Tobacco Never   Social History   Substance and Sexual Activity  Alcohol Use No    Family History:  Family History  Problem Relation Age of Onset   Hypertension Mother    Hyperlipidemia Mother    Hypertension Father    Hyperlipidemia Father    Breast cancer Maternal Aunt 12   Brain cancer Maternal Aunt    Breast cancer Paternal Aunt 67       BRCA neg   Colon cancer Paternal Aunt 48   Diabetes Mellitus II Paternal Grandfather     Past medical history, surgical history, medications, allergies, family history and social history reviewed with patient today and changes made to appropriate areas of the chart.   Review of Systems  All other systems reviewed and are negative.  All other ROS negative except what is listed above and in the HPI.      Objective:    BP 122/82   Pulse 67   Temp 98.3 F (36.8 C) (Oral)   Ht 5' 7.72" (1.72 m)   Wt 229 lb 8 oz (104.1 kg)   LMP 08/07/2021   SpO2 97%   BMI 35.19 kg/m   Wt Readings from Last 3 Encounters:  08/01/22 229 lb 8 oz (104.1 kg)  05/21/22 228 lb 12.8 oz (103.8 kg)  01/24/22 219 lb (99.3 kg)    Physical Exam Vitals and nursing note reviewed.  Constitutional:      General: She is awake. She is not in acute distress.    Appearance: Normal appearance. She is well-developed. She is obese. She is not ill-appearing.  HENT:     Head: Normocephalic and atraumatic.     Right Ear: Hearing, tympanic membrane, ear canal and external ear normal. No drainage.     Left Ear: Hearing, tympanic membrane, ear canal and external ear normal. No  drainage.     Nose: Nose normal.     Right Sinus: No maxillary sinus tenderness or frontal sinus tenderness.     Left Sinus: No maxillary sinus tenderness or frontal sinus tenderness.     Mouth/Throat:     Mouth: Mucous membranes are moist.     Pharynx: Oropharynx  is clear. Uvula midline. No pharyngeal swelling, oropharyngeal exudate or posterior oropharyngeal erythema.  Eyes:     General: Lids are normal.        Right eye: No discharge.        Left eye: No discharge.     Extraocular Movements: Extraocular movements intact.     Conjunctiva/sclera: Conjunctivae normal.     Pupils: Pupils are equal, round, and reactive to light.     Visual Fields: Right eye visual fields normal and left eye visual fields normal.  Neck:     Thyroid: No thyromegaly.     Vascular: No carotid bruit.     Trachea: Trachea normal.  Cardiovascular:     Rate and Rhythm: Normal rate and regular rhythm.     Heart sounds: Normal heart sounds. No murmur heard.    No gallop.  Pulmonary:     Effort: Pulmonary effort is normal. No accessory muscle usage or respiratory distress.     Breath sounds: Normal breath sounds.  Chest:  Breasts:    Right: Normal.     Left: Normal.  Abdominal:     General: Bowel sounds are normal.     Palpations: Abdomen is soft. There is no hepatomegaly or splenomegaly.     Tenderness: There is no abdominal tenderness.  Musculoskeletal:        General: Normal range of motion.     Cervical back: Normal range of motion and neck supple.     Right lower leg: No edema.     Left lower leg: No edema.  Lymphadenopathy:     Head:     Right side of head: No submental, submandibular, tonsillar, preauricular or posterior auricular adenopathy.     Left side of head: No submental, submandibular, tonsillar, preauricular or posterior auricular adenopathy.     Cervical: No cervical adenopathy.     Upper Body:     Right upper body: No supraclavicular, axillary or pectoral adenopathy.     Left upper  body: No supraclavicular, axillary or pectoral adenopathy.  Skin:    General: Skin is warm and dry.     Capillary Refill: Capillary refill takes less than 2 seconds.     Findings: No rash.  Neurological:     Mental Status: She is alert and oriented to person, place, and time.     Gait: Gait is intact.     Deep Tendon Reflexes: Reflexes are normal and symmetric.     Reflex Scores:      Brachioradialis reflexes are 2+ on the right side and 2+ on the left side.      Patellar reflexes are 2+ on the right side and 2+ on the left side. Psychiatric:        Attention and Perception: Attention normal.        Mood and Affect: Mood normal.        Speech: Speech normal.        Behavior: Behavior normal. Behavior is cooperative.        Thought Content: Thought content normal.        Judgment: Judgment normal.     Results for orders placed or performed in visit on 08/01/22  Urinalysis, Routine w reflex microscopic  Result Value Ref Range   Specific Gravity, UA 1.020 1.005 - 1.030   pH, UA 7.0 5.0 - 7.5   Color, UA Yellow Yellow   Appearance Ur Clear Clear   Leukocytes,UA Negative Negative   Protein,UA Negative Negative/Trace   Glucose,  UA Negative Negative   Ketones, UA Negative Negative   RBC, UA Negative Negative   Bilirubin, UA Negative Negative   Urobilinogen, Ur 0.2 0.2 - 1.0 mg/dL   Nitrite, UA Negative Negative      Assessment & Plan:   Problem List Items Addressed This Visit   None Visit Diagnoses     Annual physical exam    -  Primary   Health maintenance reviewed during visit today. Labs ordered. Mammogram up to date.  PAP d/c due to hysterectomy.   Relevant Orders   CBC with Differential/Platelet   Comprehensive metabolic panel   Lipid panel   TSH   Urinalysis, Routine w reflex microscopic (Completed)   Cytology - PAP   Screening for ischemic heart disease       Relevant Orders   Lipid panel        Follow up plan: Return in about 1 year (around 08/02/2023) for  Physical and Fasting labs.   LABORATORY TESTING:  - Pap smear: pap done  IMMUNIZATIONS:   - Tdap: Tetanus vaccination status reviewed: last tetanus booster within 10 years. - Influenza: Refused - Pneumovax: Not applicable - Prevnar: Not applicable - COVID: Not applicable - HPV: Not applicable - Shingrix vaccine: Not applicable  SCREENING: -Mammogram: Up to date  - Colonoscopy: Up to date  - Bone Density: Not applicable  -Hearing Test: Not applicable  -Spirometry: Not applicable   PATIENT COUNSELING:   Advised to take 1 mg of folate supplement per day if capable of pregnancy.   Sexuality: Discussed sexually transmitted diseases, partner selection, use of condoms, avoidance of unintended pregnancy  and contraceptive alternatives.   Advised to avoid cigarette smoking.  I discussed with the patient that most people either abstain from alcohol or drink within safe limits (<=14/week and <=4 drinks/occasion for males, <=7/weeks and <= 3 drinks/occasion for females) and that the risk for alcohol disorders and other health effects rises proportionally with the number of drinks per week and how often a drinker exceeds daily limits.  Discussed cessation/primary prevention of drug use and availability of treatment for abuse.   Diet: Encouraged to adjust caloric intake to maintain  or achieve ideal body weight, to reduce intake of dietary saturated fat and total fat, to limit sodium intake by avoiding high sodium foods and not adding table salt, and to maintain adequate dietary potassium and calcium preferably from fresh fruits, vegetables, and low-fat dairy products.    stressed the importance of regular exercise  Injury prevention: Discussed safety belts, safety helmets, smoke detector, smoking near bedding or upholstery.   Dental health: Discussed importance of regular tooth brushing, flossing, and dental visits.    NEXT PREVENTATIVE PHYSICAL DUE IN 1 YEAR. Return in about 1 year  (around 08/02/2023) for Physical and Fasting labs.

## 2022-08-01 ENCOUNTER — Ambulatory Visit (INDEPENDENT_AMBULATORY_CARE_PROVIDER_SITE_OTHER): Payer: BC Managed Care – PPO | Admitting: Nurse Practitioner

## 2022-08-01 ENCOUNTER — Encounter: Payer: Self-pay | Admitting: Nurse Practitioner

## 2022-08-01 VITALS — BP 122/82 | HR 67 | Temp 98.3°F | Ht 67.72 in | Wt 229.5 lb

## 2022-08-01 DIAGNOSIS — R7309 Other abnormal glucose: Secondary | ICD-10-CM

## 2022-08-01 DIAGNOSIS — Z136 Encounter for screening for cardiovascular disorders: Secondary | ICD-10-CM

## 2022-08-01 DIAGNOSIS — Z0000 Encounter for general adult medical examination without abnormal findings: Secondary | ICD-10-CM

## 2022-08-01 LAB — URINALYSIS, ROUTINE W REFLEX MICROSCOPIC
Bilirubin, UA: NEGATIVE
Glucose, UA: NEGATIVE
Ketones, UA: NEGATIVE
Leukocytes,UA: NEGATIVE
Nitrite, UA: NEGATIVE
Protein,UA: NEGATIVE
RBC, UA: NEGATIVE
Specific Gravity, UA: 1.02 (ref 1.005–1.030)
Urobilinogen, Ur: 0.2 mg/dL (ref 0.2–1.0)
pH, UA: 7 (ref 5.0–7.5)

## 2022-08-01 NOTE — Addendum Note (Signed)
Addended by: Jon Billings on: 08/01/2022 10:28 AM   Modules accepted: Orders

## 2022-08-02 LAB — COMPREHENSIVE METABOLIC PANEL
ALT: 17 IU/L (ref 0–32)
AST: 15 IU/L (ref 0–40)
Albumin/Globulin Ratio: 1.8 (ref 1.2–2.2)
Albumin: 4.7 g/dL (ref 3.9–4.9)
Alkaline Phosphatase: 92 IU/L (ref 44–121)
BUN/Creatinine Ratio: 18 (ref 9–23)
BUN: 13 mg/dL (ref 6–24)
Bilirubin Total: 0.3 mg/dL (ref 0.0–1.2)
CO2: 24 mmol/L (ref 20–29)
Calcium: 9.4 mg/dL (ref 8.7–10.2)
Chloride: 99 mmol/L (ref 96–106)
Creatinine, Ser: 0.72 mg/dL (ref 0.57–1.00)
Globulin, Total: 2.6 g/dL (ref 1.5–4.5)
Glucose: 116 mg/dL — ABNORMAL HIGH (ref 70–99)
Potassium: 4.1 mmol/L (ref 3.5–5.2)
Sodium: 138 mmol/L (ref 134–144)
Total Protein: 7.3 g/dL (ref 6.0–8.5)
eGFR: 108 mL/min/{1.73_m2} (ref >59)

## 2022-08-02 LAB — CBC WITH DIFFERENTIAL/PLATELET
Basophils Absolute: 0.1 10*3/uL (ref 0.0–0.2)
Basos: 1 %
EOS (ABSOLUTE): 0.2 10*3/uL (ref 0.0–0.4)
Eos: 2 %
Hematocrit: 41.3 % (ref 34.0–46.6)
Hemoglobin: 13.9 g/dL (ref 11.1–15.9)
Immature Grans (Abs): 0 10*3/uL (ref 0.0–0.1)
Immature Granulocytes: 1 %
Lymphocytes Absolute: 2.9 10*3/uL (ref 0.7–3.1)
Lymphs: 38 %
MCH: 30.7 pg (ref 26.6–33.0)
MCHC: 33.7 g/dL (ref 31.5–35.7)
MCV: 91 fL (ref 79–97)
Monocytes Absolute: 0.5 10*3/uL (ref 0.1–0.9)
Monocytes: 6 %
Neutrophils Absolute: 4.1 10*3/uL (ref 1.4–7.0)
Neutrophils: 52 %
Platelets: 353 10*3/uL (ref 150–450)
RBC: 4.53 x10E6/uL (ref 3.77–5.28)
RDW: 12.4 % (ref 11.7–15.4)
WBC: 7.7 10*3/uL (ref 3.4–10.8)

## 2022-08-02 LAB — LIPID PANEL
Chol/HDL Ratio: 5.2 ratio — ABNORMAL HIGH (ref 0.0–4.4)
Cholesterol, Total: 194 mg/dL (ref 100–199)
HDL: 37 mg/dL — ABNORMAL LOW (ref >39)
LDL Chol Calc (NIH): 125 mg/dL — ABNORMAL HIGH (ref 0–99)
Triglycerides: 181 mg/dL — ABNORMAL HIGH (ref 0–149)
VLDL Cholesterol Cal: 32 mg/dL (ref 5–40)

## 2022-08-02 LAB — TSH: TSH: 1.9 u[IU]/mL (ref 0.450–4.500)

## 2022-08-02 NOTE — Addendum Note (Signed)
Addended by: Jon Billings on: 08/02/2022 08:09 AM   Modules accepted: Orders

## 2022-08-02 NOTE — Progress Notes (Signed)
Hi Katie Cross.  It was nice to see you yesterday.  Your lab work looks good.  Your glucose was elevated so I have asked to add on a diabetic measure.  Once that returns I will let you know the result.  Your cholesterol is elevated.  I recommend a low fat diet and exercise.  Continue with your current medication regimen.  Follow up as discussed.  Please let me know if you have any questions.

## 2022-08-03 LAB — SPECIMEN STATUS REPORT

## 2022-08-03 LAB — HEMOGLOBIN A1C
Est. average glucose Bld gHb Est-mCnc: 126 mg/dL
Hgb A1c MFr Bld: 6 % — ABNORMAL HIGH (ref 4.8–5.6)

## 2022-08-05 NOTE — Progress Notes (Signed)
Hi Katie Cross.  Your A1c which is a measure of diabetes shows that you are prediabetic.  No medications needed at this time.  I encourage you to decrease your carbohydrate intake and exercise to help control this.  Please let me know if you have any questions.

## 2022-08-25 ENCOUNTER — Encounter: Payer: Self-pay | Admitting: Nurse Practitioner

## 2022-08-26 ENCOUNTER — Encounter: Payer: Self-pay | Admitting: Dermatology

## 2022-08-28 ENCOUNTER — Ambulatory Visit: Payer: BC Managed Care – PPO | Admitting: Dermatology

## 2022-08-28 DIAGNOSIS — L723 Sebaceous cyst: Secondary | ICD-10-CM | POA: Diagnosis not present

## 2022-08-28 NOTE — Patient Instructions (Addendum)
Incision and Drainage, Care After This sheet gives you information about how to care for yourself after your procedure. Your health care provider may also give you more specific instructions. If you have problems or questions, contact your health care provider. What can I expect after the procedure? After the procedure, it is common to have: Pain or discomfort around the incision site. Blood, fluid, or pus (drainage) from the incision. Redness and firm skin around the incision site. Follow these instructions at home: Medicines Take over-the-counter and prescription medicines only as told by your health care provider. If you were prescribed an antibiotic medicine, use or take it as told by your health care provider. Do not stop using the antibiotic even if you start to feel better. Wound care Follow instructions from your health care provider about how to take care of your wound. Make sure you: Wash your hands with soap and water before and after you change your bandage (dressing). If soap and water are not available, use hand sanitizer. Change your dressing and packing as told by your health care provider. If your dressing is dry or stuck when you try to remove it, moisten or wet the dressing with saline or water so that it can be removed without harming your skin or tissues. If your wound is packed, leave it in place until your health care provider tells you to remove it. To remove the packing, moisten or wet the packing with saline or water so that it can be removed without harming your skin or tissues. Leave stitches (sutures), skin glue, or adhesive strips in place. These skin closures may need to stay in place for 2 weeks or longer. If adhesive strip edges start to loosen and curl up, you may trim the loose edges. Do not remove adhesive strips completely unless your health care provider tells you to do that. Check your wound every day for signs of infection. Check for: More redness, swelling,  or pain. More fluid or blood. Warmth. Pus or a bad smell. If you were sent home with a drain tube in place, follow instructions from your health care provider about: How to empty it. How to care for it at home.  General instructions Rest the affected area. Do not take baths, swim, or use a hot tub until your health care provider approves. Ask your health care provider if you may take showers. You may only be allowed to take sponge baths. Return to your normal activities as told by your health care provider. Ask your health care provider what activities are safe for you. Your health care provider may put you on activity or lifting restrictions. The incision will continue to drain. It is normal to have some clear or slightly bloody drainage. The amount of drainage should lessen each day. Do not apply any creams, ointments, or liquids unless you have been told to by your health care provider. Keep all follow-up visits as told by your health care provider. This is important. Contact a health care provider if: Your cyst or abscess returns. You have more redness, swelling, or pain around your incision. You have more fluid or blood coming from your incision. Your incision feels warm to the touch. You have pus or a bad smell coming from your incision. You have red streaks above or below the incision site. Get help right away if: You have severe pain or bleeding. You cannot eat or drink without vomiting. You have a fever or chills. You have redness that spreads  quickly. You have decreased urine output. You become short of breath. You have chest pain. You cough up blood. The affected area becomes numb or starts to tingle. These symptoms may represent a serious problem that is an emergency. Do not wait to see if the symptoms will go away. Get medical help right away. Call your local emergency services (911 in the [place] Do not drive yourself to the hospital. Summary After this procedure, it is  common to have fluid, blood, or pus coming from the surgery site. Follow all home care instructions. You will be told how to take care of your incision, how to check for infection, and how to take medicines. If you were prescribed an antibiotic medicine, take it as told by your health care provider. Do not stop taking the antibiotic even if you start to feel better. Contact a health care provider if you have increased redness, swelling, or pain around your incision. Get help right away if you have chest pain, you vomit, you cough up blood, or you have shortness of breath. Keep all follow-up visits as told by your health care provider. This is important. This information is not intended to replace advice given to you by your health care provider. Make sure you discuss any questions you have with your health care provider. Document Revised: 02/14/2022 Document Reviewed: 08/23/2021 Elsevier Patient Education  [place]. Due to recent changes in healthcare laws, you may see results of your pathology and/or laboratory studies on MyChart before the doctors have had a chance to review them. We understand that in some cases there may be results that are confusing or concerning to you. Please understand that not all results are received at the same time and often the doctors may need to interpret multiple results in order to provide you with the best plan of care or course of treatment. Therefore, we ask that you please give [place] 2 business days to thoroughly review all your results before contacting the office for clarification. Should we see a critical lab result, you will be contacted sooner.   If You Need Anything After Your Visit  If you have any questions or concerns for your doctor, please call our main line at [phone] and press option 4 to reach your doctor's medical assistant. If no one answers, please leave a voicemail as directed and we will return your call as soon as possible. Messages  left after 4 pm will be answered the following business day.   You may also send [place] a message via [place]. We typically respond to MyChart messages within 1-2 business days.  For prescription refills, please ask your pharmacy to contact our office. Our fax number is [phone].  If you have an urgent issue when the clinic is closed that cannot wait until the next business day, you can page your doctor at the number below.    Please note that while we do our best to be available for urgent issues outside of office hours, we are not available 24/7.   If you have an urgent issue and are unable to reach [place], you may choose to seek medical care at your doctor's office, retail clinic, urgent care center, or emergency room.  If you have a medical emergency, please immediately call 911 or go to the emergency department.  Pager Numbers  - Dr. Nehemiah Massed: [phone]  - Dr. Laurence Ferrari: [phone]  - Dr. Nicole Kindred: [phone]  In the event of inclement weather, please call our main line at  [phone] for an update on the status of any delays or closures.  Dermatology Medication Tips: Please keep the boxes that topical medications come in in order to help keep track of the instructions about where and how to use these. Pharmacies typically print the medication instructions only on the boxes and not directly on the medication tubes.   If your medication is too expensive, please contact our office at [phone] option 4 or send [place] a message through [place].   We are unable to tell what your co-pay for medications will be in advance as this is different depending on your insurance coverage. However, we may be able to find a substitute medication at lower cost or fill out paperwork to get insurance to cover a needed medication.   If a prior authorization is required to get your medication covered by your insurance company, please allow [place] 1-2 business days to complete this process.  Drug prices  often vary depending on where the prescription is filled and some pharmacies may offer cheaper prices.  The website www.goodrx.com contains coupons for medications through different pharmacies. The prices here do not account for what the cost may be with help from insurance (it may be cheaper with your insurance), but the website can give you the price if you did not use any insurance.  - You can print the associated coupon and take it with your prescription to the pharmacy.  - You may also stop by our office during regular business hours and pick up a GoodRx coupon card.  - If you need your prescription sent electronically to a different pharmacy, notify our office through [facility] or by phone at [phone] option 4.     Si Usted Necesita Algo Despus de Su Visita  Tambin puede enviarnos un mensaje a travs de Pharmacist, community. Por lo general respondemos a los mensajes de MyChart en el transcurso de 1 a 2 das hbiles.  Para renovar recetas, por favor pida a su farmacia que se ponga en contacto con nuestra oficina. Harland Dingwall de fax es [place] [phone].  Si tiene un asunto urgente cuando la clnica est cerrada y que no puede esperar hasta el siguiente da hbil, puede llamar/localizar a su doctor(a) al nmero que aparece a continuacin.   Por favor, tenga en cuenta que aunque hacemos todo lo posible para estar disponibles para asuntos urgentes fuera del horario de [place], no estamos disponibles las 24 horas del da, los 7 das de la [place].   Si tiene un problema urgente y no puede comunicarse con nosotros, puede optar por buscar atencin mdica  en el consultorio de su doctor(a), en una clnica privada, en un centro de atencin urgente o en una sala de emergencias.  Si tiene Engineering geologist, por favor llame inmediatamente al 911 o vaya a la sala de emergencias.  Nmeros de bper  - Dr. Nehemiah Massed: [phone]  - Dra. Moye: [phone]  - Dra. Nicole Kindred:  [phone]  En caso de inclemencias del [place], por favor llame a Johnsie Kindred principal al [phone] para una actualizacin sobre el [place] de cualquier retraso o cierre.  Consejos para la medicacin en dermatologa: Por favor, guarde las cajas en las que vienen los medicamentos de uso tpico para ayudarle a seguir las instrucciones sobre dnde y cmo usarlos. Las farmacias generalmente imprimen las instrucciones del medicamento slo en las cajas y no directamente en los tubos del [place].   Si su medicamento es [employer], por favor, pngase en contacto con  nuestra oficina llamando al [phone] y presione la opcin 4 o envenos un mensaje a travs de Pharmacist, community.   No podemos decirle cul ser su copago por los medicamentos por adelantado ya que esto es diferente dependiendo de la cobertura de su seguro. Sin embargo, es posible que podamos encontrar un medicamento sustituto a Electrical engineer un formulario para que el seguro cubra el medicamento que se considera necesario.   Si se requiere una autorizacin previa para que su compaa de seguros [place] su medicamento, por favor permtanos de 1 a 2 das hbiles para completar este proceso.  Los precios de los medicamentos varan con frecuencia dependiendo del Environmental consultant de dnde se surte la receta y alguna farmacias pueden ofrecer precios ms baratos.  El sitio web www.goodrx.com tiene cupones para medicamentos de Airline pilot. Los precios aqu no tienen en cuenta lo que podra costar con la ayuda del seguro (puede ser ms barato con su seguro), pero el sitio web puede darle el precio si no utiliz Research scientist (physical sciences).  - Puede imprimir el cupn correspondiente y llevarlo con su receta a la farmacia.  - Tambin puede pasar por nuestra oficina durante el horario de atencin regular y Charity fundraiser una tarjeta de cupones de GoodRx.  - Si necesita que su receta se enve electrnicamente a una farmacia diferente, informe a nuestra oficina a travs de  MyChart de [place] o por telfono llamando al [phone] y presione la opcin 4.

## 2022-08-28 NOTE — Progress Notes (Signed)
   Follow-Up Visit   Subjective  Katie Cross is a 41 y.o. female who presents for the following: Skin Problem (Patient reports cyst at mid chest has gotten painful and warm to touch since Monday. ).  The following portions of the chart were reviewed this encounter and updated as appropriate:  Tobacco  Allergies  Meds  Problems  Med Hx  Surg Hx  Fam Hx      Review of Systems: No other skin or systemic complaints except as noted in HPI or Assessment and Plan.   Objective  Well appearing patient in no apparent distress; mood and affect are within normal limits.  A focused examination was performed including mid chest. Relevant physical exam findings are noted in the Assessment and Plan.  mid chest Erythematous, tender subcutaneous nodule.    Assessment & Plan  Inflamed epidermoid cyst of skin mid chest  Inflamed cyst  R/o bacterial infection  Incision and Drainage - mid chest Location: mid chest   Informed Consent: Discussed risks (permanent scarring, light or dark discoloration, infection, pain, bleeding, bruising, redness, damage to adjacent structures, and recurrence of the lesion) and benefits of the procedure, as well as the alternatives.  Informed consent was obtained.  Preparation: The area was prepped with alcohol.  Anesthesia: Lidocaine 1% with epinephrine  Procedure Details: An incision was made overlying the lesion. The lesion drained pus and white, chalky cyst material.  A large amount of fluid was drained.    Antibiotic ointment and a sterile pressure dressing were applied. The patient tolerated procedure well.  Total number of lesions drained: 1  Plan: The patient was instructed on post-op care. Recommend OTC analgesia as needed for pain.   Anaerobic and Aerobic Culture - mid chest   Return for return as schedule for surgery . I, Ruthell Rummage, CMA, am acting as scribe for Forest Gleason, MD.  Documentation: I have reviewed the above  documentation for accuracy and completeness, and I agree with the above.  Forest Gleason, MD

## 2022-09-04 LAB — ANAEROBIC AND AEROBIC CULTURE: Result 1: NEGATIVE — AB

## 2022-09-05 ENCOUNTER — Telehealth: Payer: Self-pay

## 2022-09-05 NOTE — Telephone Encounter (Signed)
Patient advised of information per Dr. Laurence Ferrari.  Will call next week if needed. aw

## 2022-09-05 NOTE — Telephone Encounter (Signed)
If this is still tender and oozing at all next week, I would like to check her in clinic and be sure we do not need to do any additional drainage before herr surgery. Thank you!

## 2022-09-05 NOTE — Telephone Encounter (Signed)
-----   Message from Alfonso Patten, MD sent at 09/04/2022  5:34 PM EDT ----- Culture from cyst showed  Enterococcus faecalis and finegoldia magna. Finegoldia magna is usually a contaminant and not causing infection. Enterococcus faecalis can cause infection.  Since we did the incision and drainage, if she is doing well, we do not need to do another antibiotic, but if she is having trouble with the cyst still, please let me know and we may consider an antibiotic as well as recheck.   MAs please call. Thank you!

## 2022-09-05 NOTE — Telephone Encounter (Signed)
Spoke with patient regarding culture results. She states the area is still tender and oozing slightly but she thinks it could be from where she is healing. Patient declines any pain or problems as she felt before area was treated.  Patient is scheduled for surgery 09/18/22 and wants to do whatever you think is best.

## 2022-09-05 NOTE — Telephone Encounter (Signed)
Left message for patient to return my call. aw 

## 2022-09-11 ENCOUNTER — Encounter: Payer: Self-pay | Admitting: Dermatology

## 2022-09-18 ENCOUNTER — Ambulatory Visit: Payer: BC Managed Care – PPO | Admitting: Dermatology

## 2022-09-18 DIAGNOSIS — L720 Epidermal cyst: Secondary | ICD-10-CM | POA: Diagnosis not present

## 2022-09-18 DIAGNOSIS — D492 Neoplasm of unspecified behavior of bone, soft tissue, and skin: Secondary | ICD-10-CM

## 2022-09-18 DIAGNOSIS — B079 Viral wart, unspecified: Secondary | ICD-10-CM | POA: Diagnosis not present

## 2022-09-18 MED ORDER — MUPIROCIN 2 % EX OINT: 1.0000 | TOPICAL_OINTMENT | Freq: Every day | CUTANEOUS | 0 refills | Status: DC

## 2022-09-18 MED ORDER — DOXYCYCLINE MONOHYDRATE 100 MG PO CAPS: 100.0000 mg | ORAL_CAPSULE | Freq: Two times a day (BID) | ORAL | 0 refills | Status: AC

## 2022-09-18 NOTE — Patient Instructions (Addendum)
Wound Care Instructions for After Surgery  On the day following your surgery, you should begin doing daily dressing changes until your sutures are removed: Remove the bandage. Cleanse the wound gently with soap and water.  Make sure you then dry the skin surrounding the wound completely or the tape will not stick to the skin. Do not use cotton balls on the wound. After the wound is clean and dry, apply the ointment (either prescription antibiotic prescribed by your doctor or plain Vaseline if nothing was prescribed) gently with a Q-tip. If you are using a bandaid to cover: Apply a bandaid large enough to cover the entire wound. If you do not have a bandaid large enough to cover the wound OR if you are sensitive to bandaid adhesive: Cut a non-stick pad (such as Telfa) to fit the size of the wound.  Cover the wound with the non-stick pad. If the wound is draining, you may want to add a small amount of gauze on top of the non-stick pad for a little added compression to the area. Use tape to seal the area completely.  For the next 1-2 weeks: Be sure to keep the wound moist with ointment 24/7 to ensure best healing. If you are unable to cover the wound with a bandage to hold the ointment in place, you may need to reapply the ointment several times a day. Do not bend over or lift heavy items to reduce the chance of elevated blood pressure to the wound. Do not participate in particularly strenuous activities.  Below is a list of dressing supplies you might need.  Cotton-tipped applicators - Q-tips Gauze pads (2x2 and/or 4x4) - All-Purpose Sponges New and clean tube of petroleum jelly (Vaseline) OR prescription antibiotic ointment if prescribed Either a bandaid large enough to cover the entire wound OR non-stick dressing material (Telfa) and Tape (Paper or Hypafix)  FOR ADULT SURGERY PATIENTS: If you need something for pain relief, you may take 1 extra strength Tylenol (acetaminophen) and 2  ibuprofen (200 mg) together every 4 hours as needed. (Do not take these medications if you are allergic to them or if you know you cannot take them for any other reason). Typically you may only need pain medication for 1-3 days.   Comments on the Post-Operative Period Slight swelling and redness often appear around the wound. This is normal and will disappear within several days following the surgery. The healing wound will drain a brownish-red-yellow discharge during healing. This is a normal phase of wound healing. As the wound begins to heal, the drainage may increase in amount. Again, this drainage is normal. Notify us if the drainage becomes persistently bloody, excessively swollen, or intensely painful or develops a foul odor or red streaks.  The healing wound will also typically be itchy. This is normal. If you have severe or persistent pain, Notify us if the discomfort is severe or persistent. Avoid alcoholic beverages when taking pain medicine.  In Case of Wound Hemorrhage A wound hemorrhage is when the bandage suddenly becomes soaked with bright red blood and flows profusely. If this happens, sit down or lie down with your head elevated. If the wound has a dressing on it, do not remove the dressing. Apply pressure to the existing gauze. If the wound is not covered, use a gauze pad to apply pressure and continue applying the pressure for 20 minutes without peeking. DO NOT COVER THE WOUND WITH A LARGE TOWEL OR WASH CLOTH. Release your hand from the   wound site but do not remove the dressing. If the bleeding has stopped, gently clean around the wound. Leave the dressing in place for 24 hours if possible. This wait time allows the blood vessels to close off so that you do not spark a new round of bleeding by disrupting the newly clotted blood vessels with an immediate dressing change. If the bleeding does not subside, continue to hold pressure for 40 minutes. If bleeding continues, page your  physician, contact an After Hours clinic or go to the Emergency Room.  Instructions for After In-Office Application of Cantharidin  1. This is a strong medicine; please follow ALL instructions.  2. Gently wash off with soap and water in four hours or sooner s directed by your physician.  3. **WARNING** this medicine can cause severe blistering, blood blisters, infection, and/or scarring if it is not washed off as directed.  4. Your progress will be rechecked in 1-2 months; call sooner if there are any questions or problems.  Cantharidin Plus is a blistering agent that comes from a beetle.  It needs to be washed off in about 4 hours after application.  Although it is painless when applied in office, it may cause symptoms of mild pain and burning several hours later.  Treated areas will swell and turn red, and blisters may form.  Vaseline and a bandaid may be applied until wound has healed.  Once healed, the skin may remain temporarily discolored.  It can take weeks to months for pigmentation to return to normal.  Advised to wash off with soap and water in 4 hours or sooner if it becomes tender before then.  Due to recent changes in healthcare laws, you may see results of your pathology and/or laboratory studies on MyChart before the doctors have had a chance to review them. We understand that in some cases there may be results that are confusing or concerning to you. Please understand that not all results are received at the same time and often the doctors may need to interpret multiple results in order to provide you with the best plan of care or course of treatment. Therefore, we ask that you please give [place] 2 business days to thoroughly review all your results before contacting the office for clarification. Should we see a critical lab result, you will be contacted sooner.   If You Need Anything After Your Visit  If you have any questions or concerns for your doctor, please call our main line at  [phone] and press option 4 to reach your doctor's medical assistant. If no one answers, please leave a voicemail as directed and we will return your call as soon as possible. Messages left after 4 pm will be answered the following business day.   You may also send [place] a message via [place]. We typically respond to MyChart messages within 1-2 business days.  For prescription refills, please ask your pharmacy to contact our office. Our fax number is [phone].  If you have an urgent issue when the clinic is closed that cannot wait until the next business day, you can page your doctor at the number below.    Please note that while we do our best to be available for urgent issues outside of office hours, we are not available 24/7.   If you have an urgent issue and are unable to reach [place], you may choose to seek medical care at your doctor's office, retail clinic, urgent care center, or emergency room.  If  you have a medical emergency, please immediately call 911 or go to the emergency department.  Pager Numbers  - Dr. Nehemiah Massed: [phone]  - Dr. Laurence Ferrari: [phone]  - Dr. Nicole Kindred: [phone]  In the event of inclement weather, please call our main line at [phone] for an update on the status of any delays or closures.  Dermatology Medication Tips: Please keep the boxes that topical medications come in in order to help keep track of the instructions about where and how to use these. Pharmacies typically print the medication instructions only on the boxes and not directly on the medication tubes.   If your medication is too expensive, please contact our office at [phone] option 4 or send [place] a message through [place].   We are unable to tell what your co-pay for medications will be in advance as this is different depending on your insurance coverage. However, we may be able to find a substitute medication at lower cost or fill out paperwork to get insurance to cover a needed  medication.   If a prior authorization is required to get your medication covered by your insurance company, please allow [place] 1-2 business days to complete this process.  Drug prices often vary depending on where the prescription is filled and some pharmacies may offer cheaper prices.  The website www.goodrx.com contains coupons for medications through different pharmacies. The prices here do not account for what the cost may be with help from insurance (it may be cheaper with your insurance), but the website can give you the price if you did not use any insurance.  - You can print the associated coupon and take it with your prescription to the pharmacy.  - You may also stop by our office during regular business hours and pick up a GoodRx coupon card.  - If you need your prescription sent electronically to a different pharmacy, notify our office through [facility] or by phone at [phone] option 4.     Si Usted Necesita Algo Despus de Su Visita  Tambin puede enviarnos un mensaje a travs de Pharmacist, community. Por lo general respondemos a los mensajes de MyChart en el transcurso de 1 a 2 das hbiles.  Para renovar recetas, por favor pida a su farmacia que se ponga en contacto con nuestra oficina. Harland Dingwall de fax es [place] [phone].  Si tiene un asunto urgente cuando la clnica est cerrada y que no puede esperar hasta el siguiente da hbil, puede llamar/localizar a su doctor(a) al nmero que aparece a continuacin.   Por favor, tenga en cuenta que aunque hacemos todo lo posible para estar disponibles para asuntos urgentes fuera del horario de [place], no estamos disponibles las 24 horas del da, los 7 das de la [place].   Si tiene un problema urgente y no puede comunicarse con nosotros, puede optar por buscar atencin mdica  en el consultorio de su doctor(a), en una clnica privada, en un centro de atencin urgente o en una sala de emergencias.  Si tiene Engineering geologist,  por favor llame inmediatamente al 911 o vaya a la sala de emergencias.  Nmeros de bper  - Dr. Nehemiah Massed: [phone]  - Dra. Moye: [phone]  - Dra. Nicole Kindred: [phone]  En caso de inclemencias del [place], por favor llame a Johnsie Kindred principal al [phone] para una actualizacin sobre el [employer] de cualquier retraso o cierre.  Consejos para la medicacin en dermatologa: Por favor, guarde las cajas en las que vienen los medicamentos de [place]  tpico para ayudarle a seguir las instrucciones sobre dnde y cmo usarlos. Las farmacias generalmente imprimen las instrucciones del medicamento slo en las cajas y no directamente en los tubos del [place].   Si su medicamento es muy caro, por favor, pngase en contacto con Zigmund Daniel llamando al [phone] y presione la opcin 4 o envenos un mensaje a travs de Pharmacist, community.   No podemos decirle cul ser su copago por los medicamentos por adelantado ya que esto es diferente dependiendo de la cobertura de su seguro. Sin embargo, es posible que podamos encontrar un medicamento sustituto a Electrical engineer un formulario para que el seguro cubra el medicamento que se considera necesario.   Si se requiere una autorizacin previa para que su compaa de seguros [place] su medicamento, por favor permtanos de 1 a 2 das hbiles para completar este proceso.  Los precios de los medicamentos varan con frecuencia dependiendo del Environmental consultant de dnde se surte la receta y alguna farmacias pueden ofrecer precios ms baratos.  El sitio web www.goodrx.com tiene cupones para medicamentos de Airline pilot. Los precios aqu no tienen en cuenta lo que podra costar con la ayuda del seguro (puede ser ms barato con su seguro), pero el sitio web puede darle el precio si no utiliz Research scientist (physical sciences).  - Puede imprimir el cupn correspondiente y llevarlo con su receta a la farmacia.  - Tambin puede pasar por nuestra oficina durante el horario de atencin  regular y Charity fundraiser una tarjeta de cupones de GoodRx.  - Si necesita que su receta se enve electrnicamente a una farmacia diferente, informe a nuestra oficina a travs de MyChart de  o por telfono llamando al [phone] y presione la opcin 4.

## 2022-09-18 NOTE — Progress Notes (Signed)
Follow-Up Visit   Subjective  Katie Cross is a 41 y.o. female who presents for the following: Procedure (Patient here today for excision of cyst at mid chest. ).  Patient advises cyst has improved since I&D.   The following portions of the chart were reviewed this encounter and updated as appropriate:   Tobacco  Allergies  Meds  Problems  Med Hx  Surg Hx  Fam Hx      Review of Systems:  No other skin or systemic complaints except as noted in HPI or Assessment and Plan.  Objective  Well appearing patient in no apparent distress; mood and affect are within normal limits.  A focused examination was performed including chest. Relevant physical exam findings are noted in the Assessment and Plan.  mid chest 2.1 cm Erythematous, subcutaneous nodule  Right Lateral Foot Verrucous papules -- Discussed viral etiology and contagion.     Assessment & Plan  Neoplasm of skin mid chest  Skin excision  Lesion length (cm):  2.1 Lesion width (cm):  1.7 Total excision diameter (cm):  2.1 Informed consent: discussed and consent obtained   Timeout: patient name, date of birth, surgical site, and procedure verified   Procedure prep:  Patient was prepped and draped in usual sterile fashion Prep type:  Chlorhexidine Anesthesia: the lesion was anesthetized in a standard fashion   Anesthetic:  1% lidocaine w/ epinephrine 1-100,000 buffered w/ 8.4% NaHCO3 (6cc lido w/epi) Instrument used comment:  15c Hemostasis achieved with: pressure and electrodesiccation    Skin repair Complexity:  Intermediate Final length (cm):  2.6 Informed consent: discussed and consent obtained   Timeout: patient name, date of birth, surgical site, and procedure verified   Procedure prep:  Patient was prepped and draped in usual sterile fashion Prep type:  Chlorhexidine Anesthesia: the lesion was anesthetized in a standard fashion   Anesthetic:  1% lidocaine w/ epinephrine 1-100,000 local  infiltration Reason for type of repair: reduce tension to allow closure, reduce the risk of dehiscence, infection, and necrosis, preserve normal anatomy and preserve normal anatomical and functional relationships   Undermining: edges undermined   Subcutaneous layers (deep stitches):  Suture size:  4-0 Suture type: Vicryl (polyglactin 910)   Fine/surface layer approximation (top stitches):  Suture size:  4-0 Suture type: Prolene (polypropylene)   Suture removal (days):  7 Hemostasis achieved with: suture, pressure and electrodesiccation Outcome: patient tolerated procedure well with no complications   Post-procedure details: wound care instructions given   Additional details:  Mupirocin and a pressure dressing applied  Specimen 1 - Surgical pathology Differential Diagnosis: r/o Cyst vs Other  Check Margins: No 0.7 cm Erythematous, subcutaneous nodule  Viral warts, unspecified type Right Lateral Foot  Discussed viral etiology and risk of spread.  Discussed multiple treatments may be required to clear warts.  Discussed possible post-treatment dyspigmentation and risk of recurrence.  Prior to procedure, discussed risks of blister formation, small wound, skin dyspigmentation, or rare scar following cryotherapy. Recommend Vaseline ointment to treated areas while healing.  Squaric Acid 3% applied to warts today. Prior to application reviewed risk of inflammation and irritation.  Cantharidin Plus is a blistering agent that comes from a beetle.  It needs to be washed off in about 4 hours after application.  Although it is painless when applied in office, it may cause symptoms of mild pain and burning several hours later.  Treated areas will swell and turn red, and blisters may form.  Vaseline and a bandaid may  be applied until wound has healed.  Once healed, the skin may remain temporarily discolored.  It can take weeks to months for pigmentation to return to normal.  Advised to wash off with  soap and water in 4 hours or sooner if it becomes tender before then.   Destruction of lesion - Right Lateral Foot  Destruction method: cryotherapy   Informed consent: discussed and consent obtained   Lesion destroyed using liquid nitrogen: Yes   Cryotherapy cycles:  2 Outcome: patient tolerated procedure well with no complications   Post-procedure details: wound care instructions given    Destruction of lesion - Right Lateral Foot  Destruction method: chemical removal   Informed consent: discussed and consent obtained   Timeout:  patient name, date of birth, surgical site, and procedure verified Chemical destruction method: cantharidin   Chemical destruction method comment:  Cantharidin plus, squaric acid Application time:  4 hours Procedure instructions: patient instructed to wash and dry area   Outcome: patient tolerated procedure well with no complications   Post-procedure details: wound care instructions given   Additional details:  Patient advised to set alarm to remind them to wash off with soap and water at the directed time.   Return in about 1 week (around 09/25/2022).  Graciella Belton, RMA, am acting as scribe for Forest Gleason, MD .  Documentation: I have reviewed the above documentation for accuracy and completeness, and I agree with the above.  Forest Gleason, MD

## 2022-09-23 ENCOUNTER — Encounter (INDEPENDENT_AMBULATORY_CARE_PROVIDER_SITE_OTHER): Payer: Self-pay

## 2022-09-23 ENCOUNTER — Encounter: Payer: Self-pay | Admitting: Dermatology

## 2022-09-25 ENCOUNTER — Encounter: Payer: Self-pay | Admitting: Dermatology

## 2022-09-25 ENCOUNTER — Ambulatory Visit (INDEPENDENT_AMBULATORY_CARE_PROVIDER_SITE_OTHER): Payer: BC Managed Care – PPO | Admitting: Dermatology

## 2022-09-25 DIAGNOSIS — Z4802 Encounter for removal of sutures: Secondary | ICD-10-CM

## 2022-09-25 MED ORDER — AMOXICILLIN-POT CLAVULANATE 875-125 MG PO TABS: 1.0000 | ORAL_TABLET | Freq: Two times a day (BID) | ORAL | 0 refills | Status: DC

## 2022-09-25 NOTE — Progress Notes (Signed)
   Follow-Up Visit   Subjective  Katie Cross is a 41 y.o. female who presents for the following: Suture / Staple Removal (Patient here for 1 week suture removal at mid chest ). Note itch and some tenderness to palpation. Otherwise doing well.  The following portions of the chart were reviewed this encounter and updated as appropriate:  Tobacco  Allergies  Meds  Problems  Med Hx  Surg Hx  Fam Hx      Review of Systems: No other skin or systemic complaints.  Objective  Well appearing patient in no apparent distress; mood and affect are within normal limits.  A focused examination was performed including chest. Relevant physical exam findings are noted in the Assessment and Plan.   Assessment & Plan  Encounter for Removal of Sutures - Incision site at the mid chest is clean, dry and intact but raised and erythematous - Wound cleansed, sutures removed, wound cleansed and steri strips applied.  - Discussed pathology results showing Skin (M), mid chest EPIDERMOID CYST, INFLAMED AND DISRUPTED --> already excised. No additional treatment needed  - Patient advised to keep steri-strips dry until they fall off. - Scars remodel for a full year. - Once steri-strips fall off, patient can apply over-the-counter silicone scar cream each night to help with scar remodeling if desired. - Patient advised to call with any concerns or if they notice any new or changing lesions. - Start augmentin twice a day for 7 days for possible infection. Previously culture at cyst at time of I&D grew E. Faecalis.  Return for keep follow up as schedule 11 /30 /23. I, Ruthell Rummage, CMA, am acting as scribe for Forest Gleason, MD.  Documentation: I have reviewed the above documentation for accuracy and completeness, and I agree with the above.  Forest Gleason, MD

## 2022-09-25 NOTE — Patient Instructions (Signed)
After Suture Removal  If your medical team has placed Steri-Strips (white adhesive strips covering the surgical site to provide extra support): Keep the area dry until they fall off.  Do not peel them off. Just let them fall off on their own.  If the edges peel up, you can trim them with scissors.   If your team has not placed Steri-Strips: Wash the area daily with soap and water. Then coat the incision site with plain Vaseline and cover with a bandage. Do this daily for 5 days after the sutures are removed. After that, no additional wound care is generally needed.  However, if you would like to help fade the scar, you can apply a silicone scar cream, gel or sheet every night. The scar will remodel for one year after the procedure. If a skin cancer was removed, be sure to keep your appointment with your dermatologist for follow-up and let your dermatology team know if you have any new or changing spots between visits.    Please call our office at [phone] for any questions or concerns.       Due to recent changes in healthcare laws, you may see results of your pathology and/or laboratory studies on MyChart before the doctors have had a chance to review them. We understand that in some cases there may be results that are confusing or concerning to you. Please understand that not all results are received at the same time and often the doctors may need to interpret multiple results in order to provide you with the best plan of care or course of treatment. Therefore, we ask that you please give us 2 business days to thoroughly review all your results before contacting the office for clarification. Should we see a critical lab result, you will be contacted sooner.   If You Need Anything After Your Visit  If you have any questions or concerns for your doctor, please call our main line at [phone] and press option 4 to reach your doctor's medical assistant. If no one answers, please leave  a voicemail as directed and we will return your call as soon as possible. Messages left after 4 pm will be answered the following business day.   You may also send us a message via MyChart. We typically respond to MyChart messages within 1-2 business days.  For prescription refills, please ask your pharmacy to contact our office. Our fax number is [phone].  If you have an urgent issue when the clinic is closed that cannot wait until the next business day, you can page your doctor at the number below.    Please note that while we do our best to be available for urgent issues outside of office hours, we are not available 24/7.   If you have an urgent issue and are unable to reach us, you may choose to seek medical care at your doctor's office, retail clinic, urgent care center, or emergency room.  If you have a medical emergency, please immediately call 911 or go to the emergency department.  Pager Numbers  - Dr. Kowalski: [phone]  - Dr. Moye: [phone]  - Dr. Stewart: [phone]  In the event of inclement weather, please call our main line at [phone] for an update on the status of any delays or closures.  Dermatology Medication Tips: Please keep the boxes that topical medications come in in order to help keep track of the instructions about where and how to use these. Pharmacies typically print the medication   instructions only on the boxes and not directly on the medication tubes.   If your medication is too expensive, please contact our office at [phone] option 4 or send us a message through MyChart.   We are unable to tell what your co-pay for medications will be in advance as this is different depending on your insurance coverage. However, we may be able to find a substitute medication at lower cost or fill out paperwork to get insurance to cover a needed medication.   If a prior authorization is required to get your medication covered by your insurance  company, please allow us 1-2 business days to complete this process.  Drug prices often vary depending on where the prescription is filled and some pharmacies may offer cheaper prices.  The website www.goodrx.com contains coupons for medications through different pharmacies. The prices here do not account for what the cost may be with help from insurance (it may be cheaper with your insurance), but the website can give you the price if you did not use any insurance.  - You can print the associated coupon and take it with your prescription to the pharmacy.  - You may also stop by our office during regular business hours and pick up a GoodRx coupon card.  - If you need your prescription sent electronically to a different pharmacy, notify our office through [facility] MyChart or by phone at [phone] option 4.     Si Usted Necesita Algo Despus de Su Visita  Tambin puede enviarnos un mensaje a travs de MyChart. Por lo general respondemos a los mensajes de MyChart en el transcurso de 1 a 2 das hbiles.  Para renovar recetas, por favor pida a su farmacia que se ponga en contacto con nuestra oficina. Nuestro nmero de fax es el [phone].  Si tiene un asunto urgente cuando la clnica est cerrada y que no puede esperar hasta el siguiente da hbil, puede llamar/localizar a su doctor(a) al nmero que aparece a continuacin.   Por favor, tenga en cuenta que aunque hacemos todo lo posible para estar disponibles para asuntos urgentes fuera del horario de oficina, no estamos disponibles las 24 horas del da, los 7 das de la semana.   Si tiene un problema urgente y no puede comunicarse con nosotros, puede optar por buscar atencin mdica  en el consultorio de su doctor(a), en una clnica privada, en un centro de atencin urgente o en una sala de emergencias.  Si tiene una emergencia mdica, por favor llame inmediatamente al 911 o vaya a la sala de emergencias.  Nmeros de bper  - Dr.  Kowalski: [phone]  - Dra. Moye: [phone]  - Dra. Stewart: [phone]  En caso de inclemencias del tiempo, por favor llame a nuestra lnea principal al [phone] para una actualizacin sobre el estado de cualquier retraso o cierre.  Consejos para la medicacin en dermatologa: Por favor, guarde las cajas en las que vienen los medicamentos de uso tpico para ayudarle a seguir las instrucciones sobre dnde y cmo usarlos. Las farmacias generalmente imprimen las instrucciones del medicamento slo en las cajas y no directamente en los tubos del medicamento.   Si su medicamento es muy caro, por favor, pngase en contacto con nuestra oficina llamando al [phone] y presione la opcin 4 o envenos un mensaje a travs de MyChart.   No podemos decirle cul ser su copago por los medicamentos por adelantado ya que esto es diferente dependiendo de la cobertura de su seguro. Sin embargo,   es posible que podamos encontrar un medicamento sustituto a menor costo o llenar un formulario para que el seguro cubra el medicamento que se considera necesario.   Si se requiere una autorizacin previa para que su compaa de seguros cubra su medicamento, por favor permtanos de 1 a 2 das hbiles para completar este proceso.  Los precios de los medicamentos varan con frecuencia dependiendo del lugar de dnde se surte la receta y alguna farmacias pueden ofrecer precios ms baratos.  El sitio web www.goodrx.com tiene cupones para medicamentos de diferentes farmacias. Los precios aqu no tienen en cuenta lo que podra costar con la ayuda del seguro (puede ser ms barato con su seguro), pero el sitio web puede darle el precio si no utiliz ningn seguro.  - Puede imprimir el cupn correspondiente y llevarlo con su receta a la farmacia.  - Tambin puede pasar por nuestra oficina durante el horario de atencin regular y recoger una tarjeta de cupones de GoodRx.  - Si necesita que su receta se enve  electrnicamente a una farmacia diferente, informe a nuestra oficina a travs de MyChart de [place] o por telfono llamando al [phone] y presione la opcin 4.  

## 2022-09-26 ENCOUNTER — Other Ambulatory Visit: Payer: Self-pay

## 2022-10-24 ENCOUNTER — Ambulatory Visit: Payer: BC Managed Care – PPO | Admitting: Dermatology

## 2022-10-24 ENCOUNTER — Encounter: Payer: Self-pay | Admitting: Dermatology

## 2022-10-24 VITALS — BP 124/72

## 2022-10-24 DIAGNOSIS — L578 Other skin changes due to chronic exposure to nonionizing radiation: Secondary | ICD-10-CM | POA: Diagnosis not present

## 2022-10-24 DIAGNOSIS — L814 Other melanin hyperpigmentation: Secondary | ICD-10-CM

## 2022-10-24 DIAGNOSIS — Z86018 Personal history of other benign neoplasm: Secondary | ICD-10-CM | POA: Diagnosis not present

## 2022-10-24 DIAGNOSIS — Z1283 Encounter for screening for malignant neoplasm of skin: Secondary | ICD-10-CM

## 2022-10-24 DIAGNOSIS — B079 Viral wart, unspecified: Secondary | ICD-10-CM

## 2022-10-24 DIAGNOSIS — L821 Other seborrheic keratosis: Secondary | ICD-10-CM

## 2022-10-24 DIAGNOSIS — D229 Melanocytic nevi, unspecified: Secondary | ICD-10-CM

## 2022-10-24 DIAGNOSIS — L905 Scar conditions and fibrosis of skin: Secondary | ICD-10-CM | POA: Diagnosis not present

## 2022-10-24 NOTE — Patient Instructions (Addendum)
Cantharidin is a blistering agent that comes from a beetle.  It needs to be washed off in about 4 hours after application.  Although it is painless when applied in office, it may cause symptoms of mild pain and burning several hours later.  Treated areas will swell and turn red, and blisters may form.  Vaseline and a bandaid may be applied until wound has healed.  Once healed, the skin may remain temporarily discolored.  It can take weeks to months for pigmentation to return to normal.  The molluscum may resolve with this topical treatment, but often, additional treatments may be required to clear molluscum.  It is recommended to keep the skin well-moisturized and avoid scratching affected area to help prevent spread of the molluscum.   Instructions for After In-Office Application of Cantharidin  1. This is a strong medicine; please follow ALL instructions.  2. Gently wash off with soap and water in four hours or sooner s directed by your physician.  3. **WARNING** this medicine can cause severe blistering, blood blisters, infection, and/or scarring if it is not washed off as directed.  4. Your progress will be rechecked in 1-2 months; call sooner if there are any questions or problems.  Recommend taking Heliocare sun protection supplement daily in sunny weather for additional sun protection. For maximum protection on the sunniest days, you can take up to 2 capsules of regular Heliocare OR take 1 capsule of Heliocare Ultra. For prolonged exposure (such as a full day in the sun), you can repeat your dose of the supplement 4 hours after your first dose. Heliocare can be purchased at [employer], at some [facility] or at [email or web address].    Melanoma ABCDEs  Melanoma is the most dangerous type of skin cancer, and is the leading cause of death from skin disease.  You are more likely to develop melanoma if you: Have light-colored skin, light-colored eyes, or red or blond hair Spend a lot of  time in the sun Tan regularly, either outdoors or in a tanning bed Have had blistering sunburns, especially during childhood Have a close family member who has had a melanoma Have atypical moles or large birthmarks  Early detection of melanoma is key since treatment is typically straightforward and cure rates are extremely high if we catch it early.   The first sign of melanoma is often a change in a mole or a new dark spot.  The ABCDE system is a way of remembering the signs of melanoma.  A for asymmetry:  The two halves do not match. B for border:  The edges of the growth are irregular. C for color:  A mixture of colors are present instead of an even brown color. D for diameter:  Melanomas are usually (but not always) greater than 79m - the size of a pencil eraser. E for evolution:  The spot keeps changing in size, shape, and color.  Please check your skin once per month between visits. You can use a small mirror in front and a large mirror behind you to keep an eye on the back side or your body.   If you see any new or changing lesions before your next follow-up, please call to schedule a visit.  Please continue daily skin protection including broad spectrum sunscreen SPF 30+ to sun-exposed areas, reapplying every 2 hours as needed when you're outdoors.    Due to recent changes in healthcare laws, you may see results of your pathology and/or laboratory studies on  MyChart before the doctors have had a chance to review them. We understand that in some cases there may be results that are confusing or concerning to you. Please understand that not all results are received at the same time and often the doctors may need to interpret multiple results in order to provide you with the best plan of care or course of treatment. Therefore, we ask that you please give [place] 2 business days to thoroughly review all your results before contacting the office for clarification. Should we see a critical lab  result, you will be contacted sooner.   If You Need Anything After Your Visit  If you have any questions or concerns for your doctor, please call our main line at [phone] and press option 4 to reach your doctor's medical assistant. If no one answers, please leave a voicemail as directed and we will return your call as soon as possible. Messages left after 4 pm will be answered the following business day.   You may also send [place] a message via [place]. We typically respond to MyChart messages within 1-2 business days.  For prescription refills, please ask your pharmacy to contact our office. Our fax number is [phone].  If you have an urgent issue when the clinic is closed that cannot wait until the next business day, you can page your doctor at the number below.    Please note that while we do our best to be available for urgent issues outside of office hours, we are not available 24/7.   If you have an urgent issue and are unable to reach [place], you may choose to seek medical care at your doctor's office, retail clinic, urgent care center, or emergency room.  If you have a medical emergency, please immediately call 911 or go to the emergency department.  Pager Numbers  - Dr. Nehemiah Massed: [phone]  - Dr. Laurence Ferrari: [phone]  - Dr. Nicole Kindred: [phone]  In the event of inclement weather, please call our main line at [phone] for an update on the status of any delays or closures.  Dermatology Medication Tips: Please keep the boxes that topical medications come in in order to help keep track of the instructions about where and how to use these. Pharmacies typically print the medication instructions only on the boxes and not directly on the medication tubes.   If your medication is too expensive, please contact our office at [phone] option 4 or send [place] a message through [place].   We are unable to tell what your co-pay for medications will be in advance as this is  different depending on your insurance coverage. However, we may be able to find a substitute medication at lower cost or fill out paperwork to get insurance to cover a needed medication.   If a prior authorization is required to get your medication covered by your insurance company, please allow [place] 1-2 business days to complete this process.  Drug prices often vary depending on where the prescription is filled and some pharmacies may offer cheaper prices.  The website www.goodrx.com contains coupons for medications through different pharmacies. The prices here do not account for what the cost may be with help from insurance (it may be cheaper with your insurance), but the website can give you the price if you did not use any insurance.  - You can print the associated coupon and take it with your prescription to the pharmacy.  - You may also stop by our office during regular business  hours and pick up a GoodRx coupon card.  - If you need your prescription sent electronically to a different pharmacy, notify our office through [facility] or by phone at [phone] option 4.     Si Usted Necesita Algo Despus de Su Visita  Tambin puede enviarnos un mensaje a travs de Pharmacist, community. Por lo general respondemos a los mensajes de MyChart en el transcurso de 1 a 2 das hbiles.  Para renovar recetas, por favor pida a su farmacia que se ponga en contacto con nuestra oficina. Harland Dingwall de fax es [place] [phone].  Si tiene un asunto urgente cuando la clnica est cerrada y que no puede esperar hasta el siguiente da hbil, puede llamar/localizar a su doctor(a) al nmero que aparece a continuacin.   Por favor, tenga en cuenta que aunque hacemos todo lo posible para estar disponibles para asuntos urgentes fuera del horario de [place], no estamos disponibles las 24 horas del da, los 7 das de la [place].   Si tiene un problema urgente y no puede comunicarse con nosotros, puede optar por buscar  atencin mdica  en el consultorio de su doctor(a), en una clnica privada, en un centro de atencin urgente o en una sala de emergencias.  Si tiene Engineering geologist, por favor llame inmediatamente al 911 o vaya a la sala de emergencias.  Nmeros de bper  - Dr. Nehemiah Massed: [phone]  - Dra. Moye: [phone]  - Dra. Nicole Kindred: [phone]  En caso de inclemencias del [place], por favor llame a Johnsie Kindred principal al [phone] para una actualizacin sobre el [place] de cualquier retraso o cierre.  Consejos para la medicacin en dermatologa: Por favor, guarde las cajas en las que vienen los medicamentos de uso tpico para ayudarle a seguir las instrucciones sobre dnde y cmo usarlos. Las farmacias generalmente imprimen las instrucciones del medicamento slo en las cajas y no directamente en los tubos del [place].   Si su medicamento es muy caro, por favor, pngase en contacto con Zigmund Daniel llamando al [phone] y presione la opcin 4 o envenos un mensaje a travs de Pharmacist, community.   No podemos decirle cul ser su copago por los medicamentos por adelantado ya que esto es diferente dependiendo de la cobertura de su seguro. Sin embargo, es posible que podamos encontrar un medicamento sustituto a Electrical engineer un formulario para que el seguro cubra el medicamento que se considera necesario.   Si se requiere una autorizacin previa para que su compaa de seguros [place] su medicamento, por favor permtanos de 1 a 2 das hbiles para completar este proceso.  Los precios de los medicamentos varan con frecuencia dependiendo del Environmental consultant de dnde se surte la receta y alguna farmacias pueden ofrecer precios ms baratos.  El sitio web www.goodrx.com tiene cupones para medicamentos de Airline pilot. Los precios aqu no tienen en cuenta lo que podra costar con la ayuda del seguro (puede ser ms barato con su seguro), pero el sitio web puede darle el precio si no utiliz  Research scientist (physical sciences).  - Puede imprimir el cupn correspondiente y llevarlo con su receta a la farmacia.  - Tambin puede pasar por nuestra oficina durante el horario de atencin regular y Charity fundraiser una tarjeta de cupones de GoodRx.  - Si necesita que su receta se enve electrnicamente a una farmacia diferente, informe a nuestra oficina a travs de MyChart de [place] o por telfono llamando al [phone] y presione la opcin 4.

## 2022-10-24 NOTE — Progress Notes (Signed)
Follow-Up Visit   Subjective  Katie Cross is a 41 y.o. female who presents for the following: Warts (Right lateral foot, treated with LN2. Squaric acid and cantharidin plus. ) and Annual Exam (Hx dysplastic nevus, FhxMM, SCC, BCC).  The patient presents for Total-Body Skin Exam (TBSE) for skin cancer screening and mole check.  The patient has spots, moles and lesions to be evaluated, some may be new or changing and the patient has concerns that these could be cancer.  Family history of skin cancer - what type(s): Melanoma, BCC, SCC - who affected: father Family history of skin cancer - what type(s): SCC - who affected: mother   The following portions of the chart were reviewed this encounter and updated as appropriate:   Tobacco  Allergies  Meds  Problems  Med Hx  Surg Hx  Fam Hx      Review of Systems:  No other skin or systemic complaints except as noted in HPI or Assessment and Plan.  Objective  Well appearing patient in no apparent distress; mood and affect are within normal limits.  A full examination was performed including scalp, head, eyes, ears, nose, lips, neck, chest, axillae, abdomen, back, buttocks, bilateral upper extremities, bilateral lower extremities, hands, feet, fingers, toes, fingernails, and toenails. All findings within normal limits unless otherwise noted below.  right lateral foot Verrucous papules -- Discussed viral etiology and contagion.   mid chest Hypertrophic     Assessment & Plan  Viral warts, unspecified type right lateral foot  Viral Wart (HPV) Counseling  Discussed viral / HPV (Human Papilloma Virus) etiology and risk of spread /infectivity to other areas of body as well as to other people.  Multiple treatments and methods may be required to clear warts and it is possible treatment may not be successful.  Treatment risks include discoloration; scarring and there is still potential for wart recurrence.  Squaric Acid 3% applied  to warts today. Prior to application reviewed risk of inflammation and irritation.  Cantharidin Plus is a blistering agent that comes from a beetle.  It needs to be washed off in about 4 hours after application.  Although it is painless when applied in office, it may cause symptoms of mild pain and burning several hours later.  Treated areas will swell and turn red, and blisters may form.  Vaseline and a bandaid may be applied until wound has healed.  Once healed, the skin may remain temporarily discolored.  It can take weeks to months for pigmentation to return to normal.  Advised to wash off with soap and water in 4 hours or sooner if it becomes tender before then.  Prior to procedure, discussed risks of blister formation, small wound, skin dyspigmentation, or rare scar following cryotherapy. Recommend Vaseline ointment to treated areas while healing.    Destruction of lesion - right lateral foot  Destruction method: cryotherapy   Informed consent: discussed and consent obtained   Timeout:  patient name, date of birth, surgical site, and procedure verified Lesion destroyed using liquid nitrogen: Yes   Cryotherapy cycles:  2 Outcome: patient tolerated procedure well with no complications   Post-procedure details: wound care instructions given   Additional details:  Patient advised to set alarm to remind them to wash off with soap and water at the directed time.  Destruction of lesion - right lateral foot  Destruction method: chemical removal   Informed consent: discussed and consent obtained   Timeout:  patient name, date of birth,  surgical site, and procedure verified Chemical destruction method comment:  Cantharidin plus, squaric acid Application time:  4 hours Procedure instructions: patient instructed to wash and dry area   Outcome: patient tolerated procedure well with no complications   Post-procedure details: wound care instructions given   Additional details:  Patient advised to  set alarm to remind them to wash off with soap and water at the directed time.  Scar mid chest  Symptomatic per patient. Inflamed on exam.  Intralesional steroid injection side effects were reviewed including thinning of the skin and discoloration, such as redness, lightening or darkening.  [place] [id number]  Intralesional injection - mid chest Location: mid chest  Informed Consent: Discussed risks (infection, pain, bleeding, bruising, thinning of the skin, loss of skin pigment, lack of resolution, and recurrence of lesion) and benefits of the procedure, as well as the alternatives. Informed consent was obtained. Preparation: The area was prepared a standard fashion.  Procedure Details: An intralesional injection was performed with Kenalog 10 mg/cc. 0.2 cc in total were injected.  Total number of injections: 4  Plan: The patient was instructed on post-op care. Recommend OTC analgesia as needed for pain.    History of Dysplastic Nevi - No evidence of recurrence today - Recommend regular full body skin exams - Recommend daily broad spectrum sunscreen SPF 30+ to sun-exposed areas, reapply every 2 hours as needed.  - Call if any new or changing lesions are noted between office visits  Lentigines - Scattered tan macules - Due to sun exposure - Benign-appearing, observe - Recommend daily broad spectrum sunscreen SPF 30+ to sun-exposed areas, reapply every 2 hours as needed. - Call for any changes  Seborrheic Keratoses - Stuck-on, waxy, tan-brown papules and/or plaques  - Benign-appearing - Discussed benign etiology and prognosis. - Observe - Call for any changes  Melanocytic Nevi - Tan-brown and/or pink-flesh-colored symmetric macules and papules - Benign appearing on exam today - Observation - Call clinic for new or changing moles - Recommend daily use of broad spectrum spf 30+ sunscreen to sun-exposed areas.   Hemangiomas - Red papules - Discussed benign nature -  Observe - Call for any changes  Actinic Damage - Chronic condition, secondary to cumulative UV/sun exposure - diffuse scaly erythematous macules with underlying dyspigmentation - Recommend daily broad spectrum sunscreen SPF 30+ to sun-exposed areas, reapply every 2 hours as needed.  - Staying in the shade or wearing long sleeves, sun glasses (UVA+UVB protection) and wide brim hats (4-inch brim around the entire circumference of the hat) are also recommended for sun protection.  - Call for new or changing lesions.  Skin cancer screening performed today.  Return in about 1 year (around 10/25/2023) for TBSE, Hx Dysplastic Nevi.  Graciella Belton, RMA, am acting as scribe for Forest Gleason, MD .  Documentation: I have reviewed the above documentation for accuracy and completeness, and I agree with the above.  Forest Gleason, MD

## 2022-11-01 ENCOUNTER — Ambulatory Visit: Payer: BC Managed Care – PPO

## 2022-11-12 ENCOUNTER — Ambulatory Visit
Admission: RE | Admit: 2022-11-12 | Discharge: 2022-11-12 | Disposition: A | Discharge status: Home / Self Care | Payer: BC Managed Care – PPO | Source: Ambulatory Visit | Attending: Obstetrics and Gynecology | Admitting: Obstetrics and Gynecology

## 2022-11-12 DIAGNOSIS — N6310 Unspecified lump in the right breast, unspecified quadrant: Secondary | ICD-10-CM | POA: Insufficient documentation

## 2022-11-12 DIAGNOSIS — Z9189 Other specified personal risk factors, not elsewhere classified: Secondary | ICD-10-CM | POA: Insufficient documentation

## 2022-11-12 MED ORDER — GADOBUTROL 1 MMOL/ML IV SOLN
10.0000 mL | Freq: Once | INTRAVENOUS | Status: AC | PRN
  Administered 2022-11-12: 10 mL via INTRAVENOUS

## 2022-12-23 IMAGING — MG MM DIGITAL SCREENING BILAT W/ TOMO AND CAD
8 series · 8 of 24 positions shown · non-contrast
Comparison: Previous exam(s).

CLINICAL DATA: Screening.

EXAM:
DIGITAL SCREENING BILATERAL MAMMOGRAM WITH TOMOSYNTHESIS AND CAD
TECHNIQUE: Bilateral screening digital craniocaudal and mediolateral oblique
mammograms were obtained. Bilateral screening digital breast
tomosynthesis was performed. The images were evaluated with
computer-aided detection.

[R MLO synth-2D]
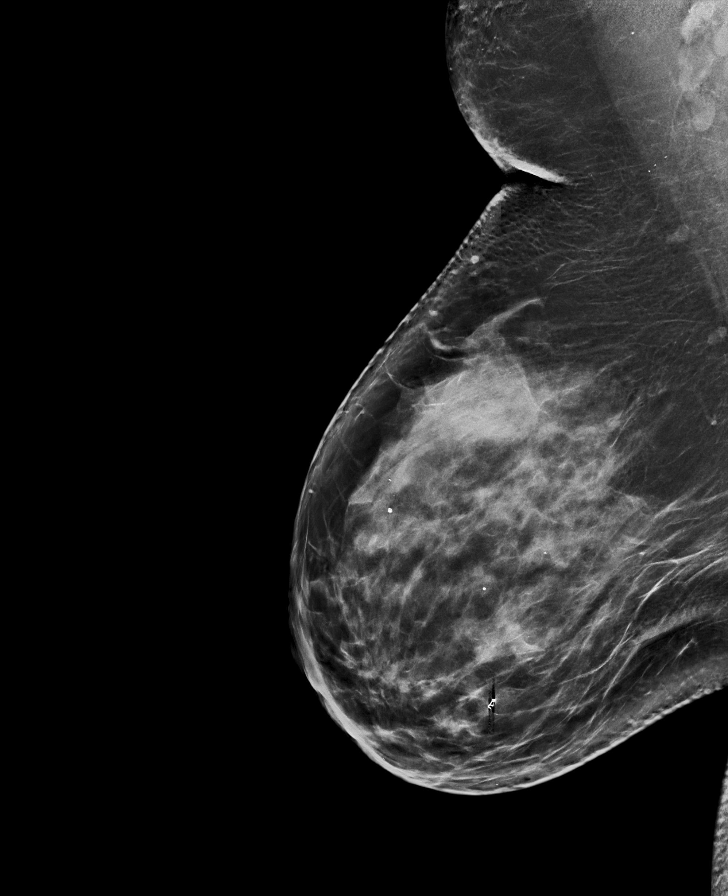

[L CC synth-2D]
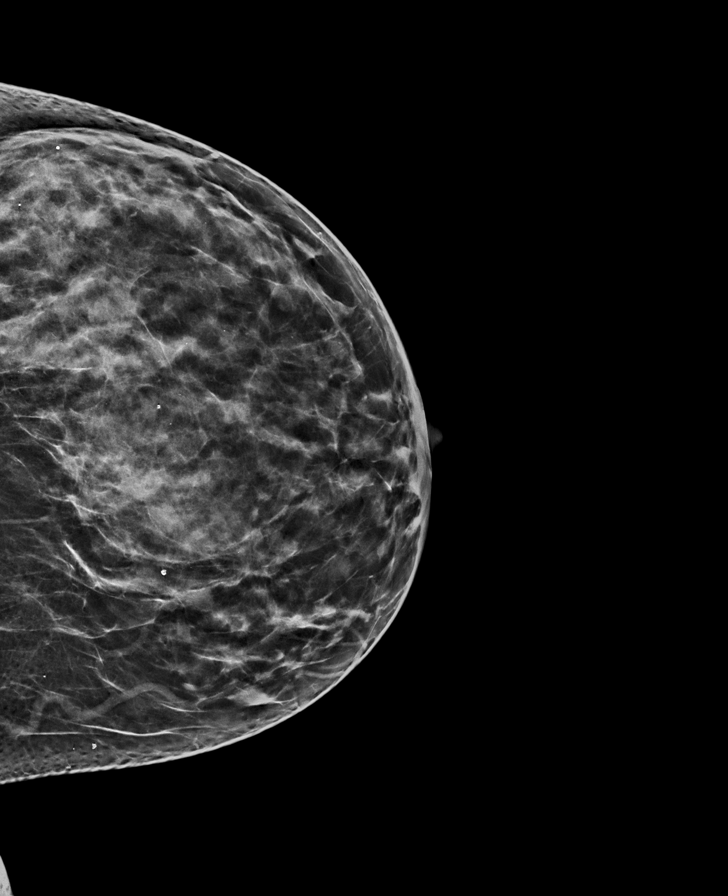

[R CC synth-2D]
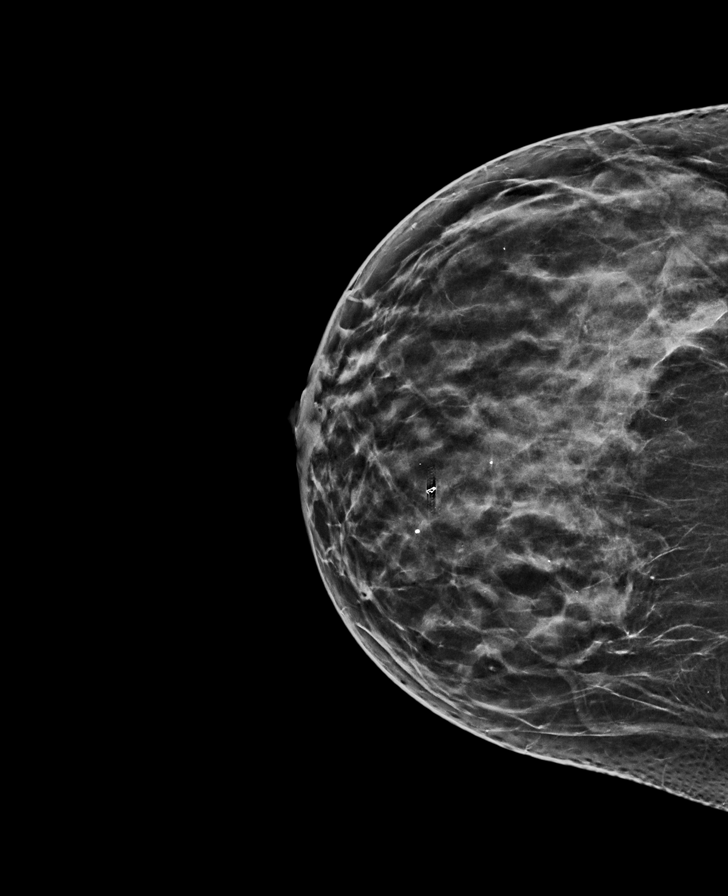

[L MLO synth-2D]
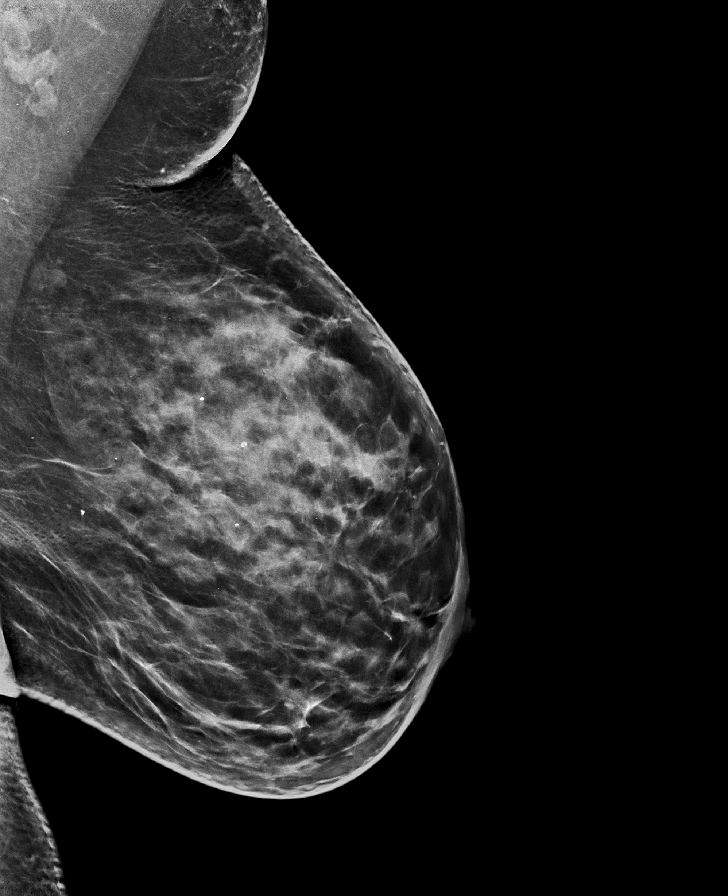

[L CC tomo · tomo slice 33/64.0]
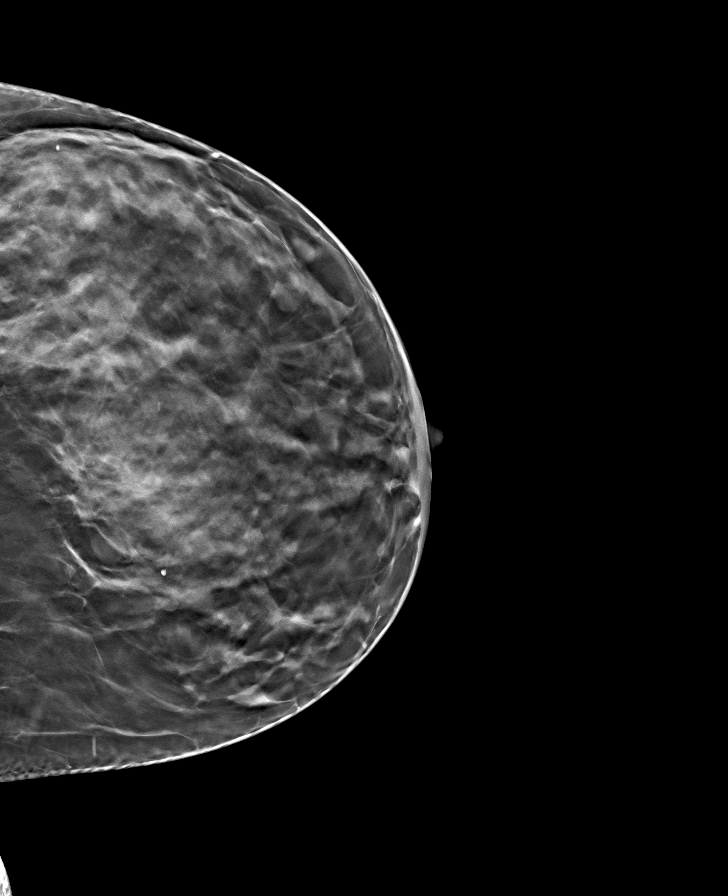

[R CC tomo · tomo slice 31/61.0]
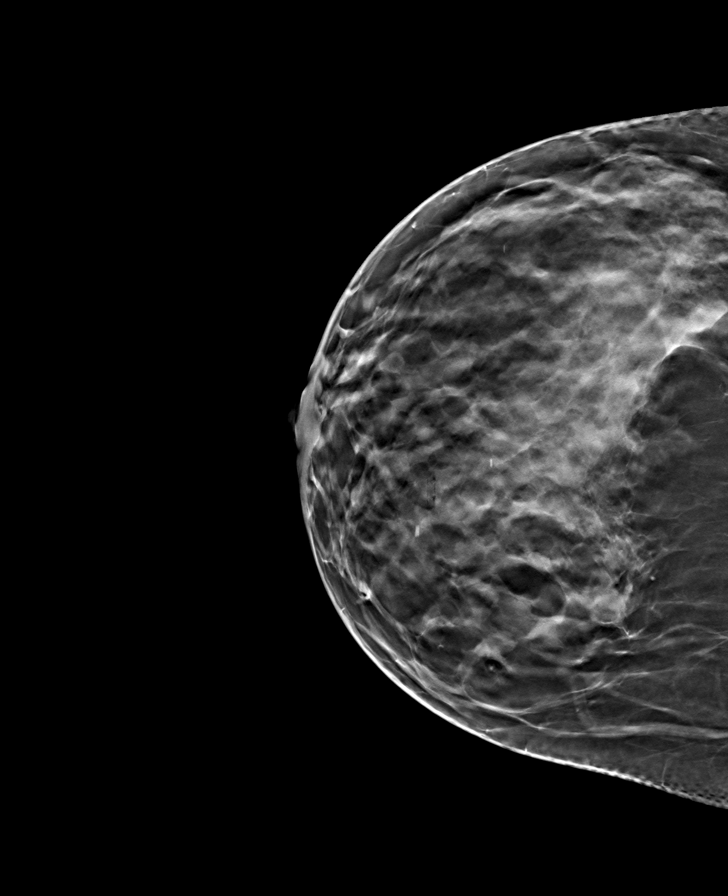

[R MLO tomo · tomo slice 46/91.0]
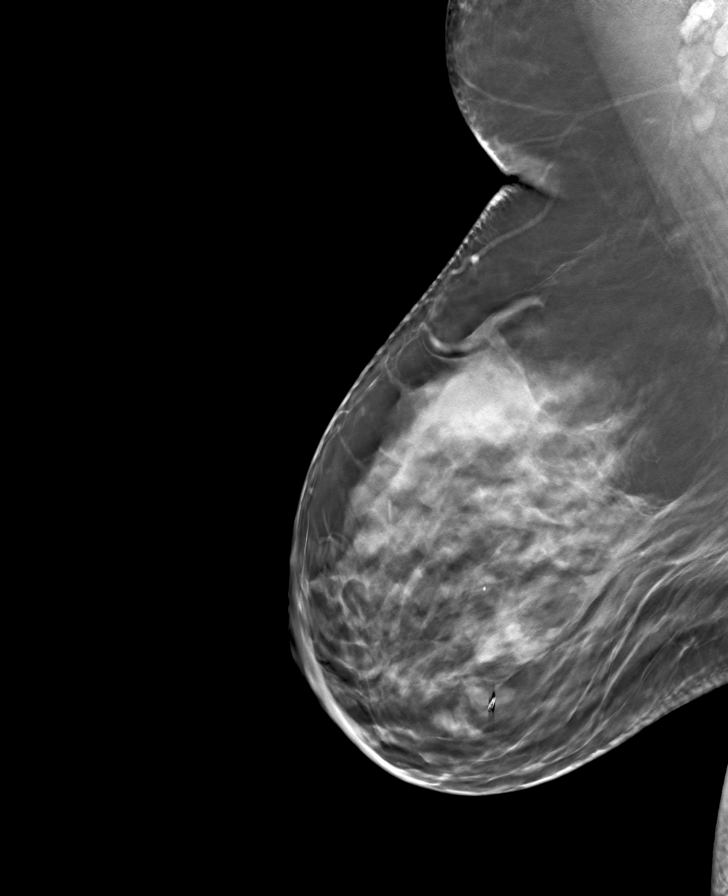

[L MLO tomo · tomo slice 44/87.0]
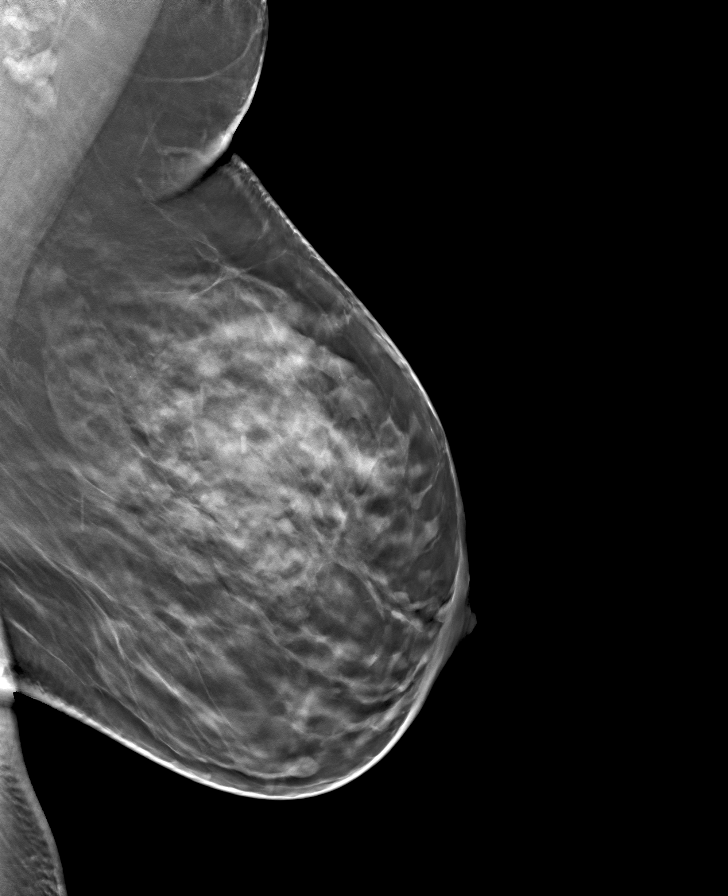

[8 of 24 positions shown; findings below may reference images not displayed]

ACR Breast Density Category d: The breast tissue is extremely dense,
which lowers the sensitivity of mammography
FINDINGS: There are no findings suspicious for malignancy.
IMPRESSION: No mammographic evidence of malignancy. A result letter of this
screening mammogram will be mailed directly to the patient.

RECOMMENDATION:
Screening mammogram in one year. (Code:G7-H-H5R) The patient is due
for her follow-up MRI in Wednesday May, 2022.

BI-RADS CATEGORY  1: Negative.

## 2022-12-24 ENCOUNTER — Telehealth: Payer: Self-pay | Admitting: Nurse Practitioner

## 2022-12-24 DIAGNOSIS — Z021 Encounter for pre-employment examination: Secondary | ICD-10-CM

## 2022-12-24 NOTE — Telephone Encounter (Signed)
I ordered the TB test.  She can drop off the form and I will fill it out.  If for some reason I need to see her again we will let her know.

## 2022-12-24 NOTE — Telephone Encounter (Signed)
Can you do this based on last physical? And order TB test?

## 2022-12-24 NOTE — Telephone Encounter (Signed)
Copied from [place] [id number]. Topic: Appointment Scheduling - Scheduling Inquiry for Clinic >> Dec 24, 2022 10:03 AM Marcellus Scott wrote: Reason for CRM: Pt stated needs TB screening and a health assessment filled out. Stated had physical 08/01/2022 unsure if she could use her last physical and just drop off forms or if an appointment is needed to have PCP fill out health assessment.  Please advise.

## 2022-12-24 NOTE — Telephone Encounter (Signed)
Called and spoke with patient. Coming by tomorrow to do blood work and drop off the form.

## 2022-12-25 ENCOUNTER — Other Ambulatory Visit: Payer: BC Managed Care – PPO

## 2022-12-25 DIAGNOSIS — Z021 Encounter for pre-employment examination: Secondary | ICD-10-CM

## 2022-12-27 NOTE — Telephone Encounter (Signed)
Patient called to see if health assessment paperwork was ready. Advised patient that office was closed for maintenance until 2/6. Patient stated it was ok to send the completed form to her mychart if needed. Please follow up with patient.

## 2022-12-30 NOTE — Telephone Encounter (Signed)
Called and notified patient that we are still waiting on her TB test results to come back and will let her know as soon as they do and paperwork is ready.

## 2022-12-31 LAB — QUANTIFERON-TB GOLD PLUS
QuantiFERON Mitogen Value: >10 IU/mL
QuantiFERON Nil Value: 0.17 IU/mL
QuantiFERON TB1 Ag Value: 0.1 IU/mL
QuantiFERON TB2 Ag Value: 0.06 IU/mL
QuantiFERON-TB Gold Plus: NEGATIVE

## 2022-12-31 NOTE — Telephone Encounter (Signed)
See result note, paperwork is ready and has been picked up by the patient.

## 2022-12-31 NOTE — Progress Notes (Signed)
Hi Ortencia. Your Quantiferon Gold is negative. I have filled out the paperwork.    We have temporarily relocated to [address] [place], [place], [place] [address].

## 2023-03-13 ENCOUNTER — Other Ambulatory Visit: Payer: Self-pay | Admitting: Nurse Practitioner

## 2023-03-13 DIAGNOSIS — Z1231 Encounter for screening mammogram for malignant neoplasm of breast: Secondary | ICD-10-CM

## 2023-04-07 ENCOUNTER — Other Ambulatory Visit: Payer: Self-pay

## 2023-04-07 DIAGNOSIS — Z86018 Personal history of other benign neoplasm: Secondary | ICD-10-CM

## 2023-04-07 NOTE — Progress Notes (Signed)
Patient wanted to see another female provider in [facility] dermatology since Dr. Neale Burly is leaving our practice. She needs to establish care and have TBSE.

## 2023-04-09 ENCOUNTER — Encounter: Payer: Self-pay | Admitting: Dermatology

## 2023-04-29 ENCOUNTER — Ambulatory Visit
Admission: RE | Admit: 2023-04-29 | Discharge: 2023-04-29 | Disposition: A | Discharge status: Home / Self Care | Payer: BC Managed Care – PPO | Source: Ambulatory Visit | Attending: Nurse Practitioner | Admitting: Nurse Practitioner

## 2023-04-29 DIAGNOSIS — Z1231 Encounter for screening mammogram for malignant neoplasm of breast: Secondary | ICD-10-CM

## 2023-04-30 ENCOUNTER — Encounter: Payer: Self-pay | Admitting: Dermatology

## 2023-04-30 NOTE — Progress Notes (Signed)
Please let patient know her Mammogram did not show any evidence of a malignancy.  The recommendation is to repeat the Mammogram in 1 year.  

## 2023-05-01 ENCOUNTER — Telehealth: Payer: Self-pay

## 2023-05-01 ENCOUNTER — Encounter: Payer: Self-pay | Admitting: Nurse Practitioner

## 2023-05-01 NOTE — Telephone Encounter (Signed)
Spoke w/pharmacy. Advised provider is no longer with our practice. Patient needs appointment before refill can be authorized or she can request from PCP.

## 2023-05-05 NOTE — Progress Notes (Unsigned)
   LMP 08/07/2021    Subjective:    Patient ID: Katie Cross, female    DOB: 1981/05/15, 42 y.o.   MRN: [id number]  HPI: Katie Cross is a 42 y.o. female  No chief complaint on file.  MOOD  Relevant past medical, surgical, family and social history reviewed and updated as indicated. Interim medical history since our last visit reviewed. Allergies and medications reviewed and updated.  Review of Systems  Per HPI unless specifically indicated above     Objective:    LMP 08/07/2021   Wt Readings from Last 3 Encounters:  08/01/22 229 lb 8 oz (104.1 kg)  05/21/22 228 lb 12.8 oz (103.8 kg)  01/24/22 219 lb (99.3 kg)    Physical Exam  Results for orders placed or performed in visit on 12/25/22  QuantiFERON-TB Gold Plus  Result Value Ref Range   QuantiFERON Incubation Incubation performed.    QuantiFERON Criteria Comment    QuantiFERON TB1 Ag Value 0.10 IU/mL   QuantiFERON TB2 Ag Value 0.06 IU/mL   QuantiFERON Nil Value 0.17 IU/mL   QuantiFERON Mitogen Value >10.00 IU/mL   QuantiFERON-TB Gold Plus Negative Negative      Assessment & Plan:   Problem List Items Addressed This Visit       Other   Anxiety - Primary     Follow up plan: No follow-ups on file.

## 2023-05-06 ENCOUNTER — Encounter: Payer: Self-pay | Admitting: Nurse Practitioner

## 2023-05-06 ENCOUNTER — Telehealth (INDEPENDENT_AMBULATORY_CARE_PROVIDER_SITE_OTHER): Payer: BC Managed Care – PPO | Admitting: Nurse Practitioner

## 2023-05-06 DIAGNOSIS — F419 Anxiety disorder, unspecified: Secondary | ICD-10-CM

## 2023-05-06 DIAGNOSIS — F32A Depression, unspecified: Secondary | ICD-10-CM | POA: Diagnosis not present

## 2023-05-06 MED ORDER — FLUOXETINE HCL 20 MG PO CAPS: ORAL_CAPSULE | ORAL | 1 refills | Status: DC

## 2023-05-06 NOTE — Progress Notes (Signed)
Appointment has been made

## 2023-05-06 NOTE — Assessment & Plan Note (Signed)
Chronic.  Controlled.  Continue with current medication regimen of Fluoxetine daily.  Refills sent today.  Return to clinic in 6 months for reevaluation.  Call sooner if concerns arise.

## 2023-08-04 ENCOUNTER — Encounter: Payer: BC Managed Care – PPO | Admitting: Physician Assistant

## 2023-10-26 ENCOUNTER — Other Ambulatory Visit: Payer: Self-pay | Admitting: Nurse Practitioner

## 2023-10-26 DIAGNOSIS — F419 Anxiety disorder, unspecified: Secondary | ICD-10-CM

## 2023-10-29 NOTE — Telephone Encounter (Signed)
Requested Prescriptions  Pending Prescriptions Disp Refills   FLUoxetine (PROZAC) 20 MG capsule [Pharmacy Med Name: FLUOXETINE HCL 20 MG CAPSULE] 90 capsule 0    Sig: TAKE 1 CAPSULE BY MOUTH EVERY DAY     Psychiatry:  Antidepressants - SSRI Passed - 10/26/2023  9:03 AM      Passed - Completed PHQ-2 or PHQ-9 in the last 360 days      Passed - Valid encounter within last 6 months    Recent Outpatient Visits           5 months ago Anxiety and depression   [place] [facility] Larae Grooms, NP   1 year ago Annual physical exam   [facility] [facility] Larae Grooms, NP   1 year ago Encounter to establish care   [place] [facility] Larae Grooms, NP       Future Appointments             In 2 weeks Larae Grooms, NP [facility]

## 2023-10-30 ENCOUNTER — Encounter: Payer: BC Managed Care – PPO | Admitting: Dermatology

## 2023-10-30 DIAGNOSIS — Z86018 Personal history of other benign neoplasm: Secondary | ICD-10-CM | POA: Insufficient documentation

## 2023-11-06 ENCOUNTER — Encounter: Payer: BC Managed Care – PPO | Admitting: Nurse Practitioner

## 2023-11-17 ENCOUNTER — Encounter: Payer: Self-pay | Admitting: Nurse Practitioner

## 2023-11-17 ENCOUNTER — Ambulatory Visit (INDEPENDENT_AMBULATORY_CARE_PROVIDER_SITE_OTHER): Payer: BC Managed Care – PPO | Admitting: Nurse Practitioner

## 2023-11-17 VITALS — BP 126/83 | HR 83 | Temp 98.0°F | Ht 66.0 in | Wt 229.4 lb

## 2023-11-17 DIAGNOSIS — Z1159 Encounter for screening for other viral diseases: Secondary | ICD-10-CM

## 2023-11-17 DIAGNOSIS — F419 Anxiety disorder, unspecified: Secondary | ICD-10-CM | POA: Diagnosis not present

## 2023-11-17 DIAGNOSIS — Z23 Encounter for immunization: Secondary | ICD-10-CM

## 2023-11-17 DIAGNOSIS — Z0000 Encounter for general adult medical examination without abnormal findings: Secondary | ICD-10-CM | POA: Diagnosis not present

## 2023-11-17 DIAGNOSIS — Z136 Encounter for screening for cardiovascular disorders: Secondary | ICD-10-CM

## 2023-11-17 DIAGNOSIS — Z114 Encounter for screening for human immunodeficiency virus [HIV]: Secondary | ICD-10-CM

## 2023-11-17 DIAGNOSIS — F32A Depression, unspecified: Secondary | ICD-10-CM

## 2023-11-17 DIAGNOSIS — Z1231 Encounter for screening mammogram for malignant neoplasm of breast: Secondary | ICD-10-CM

## 2023-11-17 LAB — URINALYSIS, ROUTINE W REFLEX MICROSCOPIC
Bilirubin, UA: NEGATIVE
Glucose, UA: NEGATIVE
Ketones, UA: NEGATIVE
Leukocytes,UA: NEGATIVE
Nitrite, UA: NEGATIVE
Protein,UA: NEGATIVE
Specific Gravity, UA: 1.025 (ref 1.005–1.030)
Urobilinogen, Ur: 0.2 mg/dL (ref 0.2–1.0)
pH, UA: 7 (ref 5.0–7.5)

## 2023-11-17 LAB — MICROSCOPIC EXAMINATION
Bacteria, UA: NONE SEEN
WBC, UA: NONE SEEN /[HPF] (ref 0–5)

## 2023-11-17 MED ORDER — FLUOXETINE HCL 20 MG PO CAPS: ORAL_CAPSULE | ORAL | 1 refills | Status: DC

## 2023-11-17 NOTE — Assessment & Plan Note (Signed)
 Chronic.  Controlled.  Continue with current medication regimen of Fluoxetine 20mg  daily.  Refills sent today.  Labs ordered today.  Return to clinic in 6 months for reevaluation.  Call sooner if concerns arise.

## 2023-11-17 NOTE — Progress Notes (Signed)
BP 126/83 (BP Location: Left Arm, Patient Position: Sitting, Cuff Size: Large)   Pulse 83   Temp 98 F (36.7 C) (Oral)   Ht 5\' 6"  (1.676 m)   Wt 229 lb 6.4 oz (104.1 kg)   LMP 08/07/2021   SpO2 98%   BMI 37.03 kg/m    Subjective:    Patient ID: Katie Cross, female    DOB: [birth year], 42 y.o.   MRN: [id number]  HPI: Katie Cross is a 42 y.o. female presenting on 11/17/2023 for comprehensive medical examination. Current medical complaints include:none  She currently lives with: Menopausal Symptoms: no  MOOD Patient states she is doing well with the Fluoxetine.  She feels like this is a good dose for her.  Denies concerns at visit today.    Depression Screen done today and results listed below:     11/17/2023    9:34 AM 05/06/2023    8:01 AM 08/01/2022    9:56 AM 05/21/2022   10:06 AM 02/25/2022   10:58 AM  Depression screen PHQ 2/9  Decreased Interest 0 0 0 0 1  Down, Depressed, Hopeless 0 0 0 0 0  PHQ - 2 Score 0 0 0 0 1  Altered sleeping 1 1 0 1 0  Tired, decreased energy 0 1 0 1 0  Change in appetite 0 0 0 0 0  Feeling bad or failure about yourself  0 0 1 0 1  Trouble concentrating 0 0 1 0 1  Moving slowly or fidgety/restless 0 0 0 0 0  Suicidal thoughts 0 0 0 0 0  PHQ-9 Score 1 2 2 2 3   Difficult doing work/chores   Somewhat difficult Not difficult at all Not difficult at all    The patient does not have a history of falls. I did complete a risk assessment for falls. A plan of care for falls was documented.   Past Medical History:  Past Medical History:  Diagnosis Date   Anemia    Anxiety    Appendicitis    Dysplastic nevus 08/07/2010   left med lower leg mild to moderate, left buttock moderate atypia   Dysplastic nevus 12/12/2015   right upper abdomen, halo nevus with mild atypia, limited margins free.   Dysplastic nevus 06/23/2018   left spinal lower back, moderate atypia, margins free.   Dysplastic nevus 10/24/2021   L upper back, severe  dysplastic, excision scheduled   Dysplastic nevus 10/24/2021   L buttock, Mod dysplastic   Family history of breast cancer    pt declines MyRisk testing   Pelvic pain     Surgical History:  Past Surgical History:  Procedure Laterality Date   APPENDECTOMY     BREAST BIOPSY Right 11/30/2021   [place] Bx, Venus Clip, path pending   CESAREAN SECTION     x3   COLONOSCOPY WITH PROPOFOL N/A 11/12/2021   Procedure: COLONOSCOPY WITH BIOPSY;  Surgeon: Midge Minium, MD;  Location: [facility];  Service: Endoscopy;  Laterality: N/A;   CYSTOSCOPY N/A 09/13/2021   Procedure: CYSTOSCOPY;  Surgeon: Natale Milch, MD;  Location: [facility];  Service: Gynecology;  Laterality: N/A;   DILATION AND EVACUATION     miscarriage   LAPAROSCOPY  2006   removed scar tissue/adhesions   PILONIDAL CYST EXCISION N/A 07/27/2021   Procedure: CYST EXCISION PILONIDAL EXTENSIVE;  Surgeon: Earline Mayotte, MD;  Location: [facility];  Service: General;  Laterality: N/A;   POLYPECTOMY N/A 11/12/2021   Procedure:  POLYPECTOMY;  Surgeon: Midge Minium, MD;  Location: [facility];  Service: Endoscopy;  Laterality: N/A;   ROBOTIC ASSISTED LAPAROSCOPIC HYSTERECTOMY AND SALPINGECTOMY Bilateral 09/13/2021   Procedure: XI ROBOTIC ASSISTED LAPAROSCOPIC TOTAL HYSTERECTOMY AND BILATERAL  SALPINGECTOMY;  Surgeon: Natale Milch, MD;  Location: [facility];  Service: Gynecology;  Laterality: Bilateral;   TUBAL LIGATION      Medications:  No current outpatient medications on file prior to visit.   No current facility-administered medications on file prior to visit.    Allergies:  No Known Allergies  Social History:  Social History   Socioeconomic History   Marital status: Married    Spouse name: Not on file   Number of children: Not on file   Years of education: Not on file   Highest education level: Not on file  Occupational History   Not on file  Tobacco Use   Smoking status: Never   Smokeless  tobacco: Never  Vaping Use   Vaping status: Never Used  Substance and Sexual Activity   Alcohol use: No   Drug use: No   Sexual activity: Yes    Birth control/protection: Surgical    Comment: tubal  Other Topics Concern   Not on file  Social History Narrative   Not on file   Social Drivers of Health   Financial Resource Strain: Not on file  Food Insecurity: Not on file  Transportation Needs: Not on file  Physical Activity: Not on file  Stress: Not on file  Social Connections: Not on file  Intimate Partner Violence: Not on file   Social History   Tobacco Use  Smoking Status Never  Smokeless Tobacco Never   Social History   Substance and Sexual Activity  Alcohol Use No    Family History:  Family History  Problem Relation Age of Onset   Hypertension Mother    Hyperlipidemia Mother    Squamous cell carcinoma Mother    Hypertension Father    Hyperlipidemia Father    Basal cell carcinoma Father    Squamous cell carcinoma Father    Melanoma Father    Diabetes Mellitus II Paternal Grandfather    Breast cancer Maternal Aunt 84   Brain cancer Maternal Aunt    Breast cancer Paternal Aunt 93       BRCA neg   Colon cancer Paternal Aunt 8    Past medical history, surgical history, medications, allergies, family history and social history reviewed with patient today and changes made to appropriate areas of the chart.   Review of Systems  Psychiatric/Behavioral:  Positive for depression. Negative for suicidal ideas. The patient is nervous/anxious.   All other systems reviewed and are negative.  All other ROS negative except what is listed above and in the HPI.      Objective:    BP 126/83 (BP Location: Left Arm, Patient Position: Sitting, Cuff Size: Large)   Pulse 83   Temp 98 F (36.7 C) (Oral)   Ht 5\' 6"  (1.676 m)   Wt 229 lb 6.4 oz (104.1 kg)   LMP 08/07/2021   SpO2 98%   BMI 37.03 kg/m   Wt Readings from Last 3 Encounters:  11/17/23 229 lb 6.4 oz  (104.1 kg)  08/01/22 229 lb 8 oz (104.1 kg)  05/21/22 228 lb 12.8 oz (103.8 kg)    Physical Exam Vitals and nursing note reviewed.  Constitutional:      General: She is awake. She is not in acute distress.  Appearance: Normal appearance. She is well-developed. She is obese. She is not ill-appearing.  HENT:     Head: Normocephalic and atraumatic.     Right Ear: Hearing, tympanic membrane, ear canal and external ear normal. No drainage.     Left Ear: Hearing, tympanic membrane, ear canal and external ear normal. No drainage.     Nose: Nose normal.     Right Sinus: No maxillary sinus tenderness or frontal sinus tenderness.     Left Sinus: No maxillary sinus tenderness or frontal sinus tenderness.     Mouth/Throat:     Mouth: Mucous membranes are moist.     Pharynx: Oropharynx is clear. Uvula midline. No pharyngeal swelling, oropharyngeal exudate or posterior oropharyngeal erythema.  Eyes:     General: Lids are normal.        Right eye: No discharge.        Left eye: No discharge.     Extraocular Movements: Extraocular movements intact.     Conjunctiva/sclera: Conjunctivae normal.     Pupils: Pupils are equal, round, and reactive to light.     Visual Fields: Right eye visual fields normal and left eye visual fields normal.  Neck:     Thyroid: No thyromegaly.     Vascular: No carotid bruit.     Trachea: Trachea normal.  Cardiovascular:     Rate and Rhythm: Normal rate and regular rhythm.     Heart sounds: Normal heart sounds. No murmur heard.    No gallop.  Pulmonary:     Effort: Pulmonary effort is normal. No accessory muscle usage or respiratory distress.     Breath sounds: Normal breath sounds.  Chest:  Breasts:    Right: Normal.     Left: Normal.  Abdominal:     General: Bowel sounds are normal.     Palpations: Abdomen is soft. There is no hepatomegaly or splenomegaly.     Tenderness: There is no abdominal tenderness.  Musculoskeletal:        General: Normal range of  motion.     Cervical back: Normal range of motion and neck supple.     Right lower leg: No edema.     Left lower leg: No edema.  Lymphadenopathy:     Head:     Right side of head: No submental, submandibular, tonsillar, preauricular or posterior auricular adenopathy.     Left side of head: No submental, submandibular, tonsillar, preauricular or posterior auricular adenopathy.     Cervical: No cervical adenopathy.     Upper Body:     Right upper body: No supraclavicular, axillary or pectoral adenopathy.     Left upper body: No supraclavicular, axillary or pectoral adenopathy.  Skin:    General: Skin is warm and dry.     Capillary Refill: Capillary refill takes less than 2 seconds.     Findings: No rash.  Neurological:     Mental Status: She is alert and oriented to person, place, and time.     Gait: Gait is intact.     Deep Tendon Reflexes: Reflexes are normal and symmetric.     Reflex Scores:      Brachioradialis reflexes are 2+ on the right side and 2+ on the left side.      Patellar reflexes are 2+ on the right side and 2+ on the left side. Psychiatric:        Attention and Perception: Attention normal.        Mood and Affect: Mood normal.  Speech: Speech normal.        Behavior: Behavior normal. Behavior is cooperative.        Thought Content: Thought content normal.        Judgment: Judgment normal.     Results for orders placed or performed in visit on 12/25/22  QuantiFERON-TB Gold Plus   Collection Time: 12/25/22 10:08 AM  Result Value Ref Range   QuantiFERON Incubation Incubation performed.    QuantiFERON Criteria Comment    QuantiFERON TB1 Ag Value 0.10 IU/mL   QuantiFERON TB2 Ag Value 0.06 IU/mL   QuantiFERON Nil Value 0.17 IU/mL   QuantiFERON Mitogen Value >10.00 IU/mL   QuantiFERON-TB Gold Plus Negative Negative      Assessment & Plan:   Problem List Items Addressed This Visit       Other   Anxiety and depression   Chronic.  Controlled.  Continue  with current medication regimen of Fluoxetine 20mg  daily.  Refills sent today.  Labs ordered today.  Return to clinic in 6 months for reevaluation.  Call sooner if concerns arise.        Relevant Medications   FLUoxetine (PROZAC) 20 MG capsule   Other Visit Diagnoses       Annual physical exam    -  Primary   Health maintenance reviewed during visit today.  Labs ordered. TDAP given.  Mammogram and Colonoscopy up to date.   Relevant Orders   CBC with Differential/Platelet   Comprehensive metabolic panel   Lipid panel   TSH   Urinalysis, Routine w reflex microscopic     Screening for ischemic heart disease       Relevant Orders   Lipid panel     Screening for HIV (human immunodeficiency virus)       Relevant Orders   HIV Antibody (routine testing w rflx)     Need for hepatitis C screening test       Relevant Orders   Hepatitis C Antibody     Encounter for screening mammogram for malignant neoplasm of breast       Relevant Orders   MM 3D SCREENING MAMMOGRAM BILATERAL BREAST   MR BREAST BILATERAL W WO CONTRAST INC CAD     Need for tetanus booster       Relevant Orders   Tdap vaccine greater than or equal to 7yo IM         Follow up plan: Return in about 6 months (around 05/17/2024) for Depression/Anxiety FU.   LABORATORY TESTING:  - Pap smear: Up to date  IMMUNIZATIONS:   - Tdap: Tetanus vaccination status reviewed: Given today - Influenza: Refused - Pneumovax: Not applicable - Prevnar: Not applicable - COVID: Not applicable - HPV: Not applicable - Shingrix vaccine: Not applicable  SCREENING: -Mammogram: Up to date  - Colonoscopy: Up to date  - Bone Density: Not applicable  -Hearing Test: Not applicable  -Spirometry: Not applicable   PATIENT COUNSELING:   Advised to take 1 mg of folate supplement per day if capable of pregnancy.   Sexuality: Discussed sexually transmitted diseases, partner selection, use of condoms, avoidance of unintended pregnancy  and  contraceptive alternatives.   Advised to avoid cigarette smoking.  I discussed with the patient that most people either abstain from alcohol or drink within safe limits (<=14/week and <=4 drinks/occasion for males, <=7/weeks and <= 3 drinks/occasion for females) and that the risk for alcohol disorders and other health effects rises proportionally with the number of drinks per week and  how often a drinker exceeds daily limits.  Discussed cessation/primary prevention of drug use and availability of treatment for abuse.   Diet: Encouraged to adjust caloric intake to maintain  or achieve ideal body weight, to reduce intake of dietary saturated fat and total fat, to limit sodium intake by avoiding high sodium foods and not adding table salt, and to maintain adequate dietary potassium and calcium preferably from fresh fruits, vegetables, and low-fat dairy products.    stressed the importance of regular exercise  Injury prevention: Discussed safety belts, safety helmets, smoke detector, smoking near bedding or upholstery.   Dental health: Discussed importance of regular tooth brushing, flossing, and dental visits.    NEXT PREVENTATIVE PHYSICAL DUE IN 1 YEAR. Return in about 6 months (around 05/17/2024) for Depression/Anxiety FU.

## 2023-11-17 NOTE — Patient Instructions (Signed)
Please call to schedule your mammogram and/or bone density: [facility]  Address: [address], [place], [place] [address] Phone: [phone]  [facility] [place] [address] [place],  [place]  [address] Phone: [phone]   

## 2023-11-18 LAB — CBC WITH DIFFERENTIAL/PLATELET
Basophils Absolute: 0.1 10*3/uL (ref 0.0–0.2)
Basos: 1 %
EOS (ABSOLUTE): 0.3 10*3/uL (ref 0.0–0.4)
Eos: 3 %
Hematocrit: 43.2 % (ref 34.0–46.6)
Hemoglobin: 14.2 g/dL (ref 11.1–15.9)
Immature Grans (Abs): 0 10*3/uL (ref 0.0–0.1)
Immature Granulocytes: 0 %
Lymphocytes Absolute: 2.2 10*3/uL (ref 0.7–3.1)
Lymphs: 21 %
MCH: 30.9 pg (ref 26.6–33.0)
MCHC: 32.9 g/dL (ref 31.5–35.7)
MCV: 94 fL (ref 79–97)
Monocytes Absolute: 0.6 10*3/uL (ref 0.1–0.9)
Monocytes: 5 %
Neutrophils Absolute: 7.2 10*3/uL — ABNORMAL HIGH (ref 1.4–7.0)
Neutrophils: 70 %
Platelets: 367 10*3/uL (ref 150–450)
RBC: 4.59 x10E6/uL (ref 3.77–5.28)
RDW: 12.3 % (ref 11.7–15.4)
WBC: 10.4 10*3/uL (ref 3.4–10.8)

## 2023-11-18 LAB — LIPID PANEL
Chol/HDL Ratio: 5.3 {ratio} — ABNORMAL HIGH (ref 0.0–4.4)
Cholesterol, Total: 208 mg/dL — ABNORMAL HIGH (ref 100–199)
HDL: 39 mg/dL — ABNORMAL LOW (ref >39)
LDL Chol Calc (NIH): 139 mg/dL — ABNORMAL HIGH (ref 0–99)
Triglycerides: 164 mg/dL — ABNORMAL HIGH (ref 0–149)
VLDL Cholesterol Cal: 30 mg/dL (ref 5–40)

## 2023-11-18 LAB — COMPREHENSIVE METABOLIC PANEL
ALT: 26 [IU]/L (ref 0–32)
AST: 18 [IU]/L (ref 0–40)
Albumin: 5 g/dL — ABNORMAL HIGH (ref 3.9–4.9)
Alkaline Phosphatase: 112 [IU]/L (ref 44–121)
BUN/Creatinine Ratio: 16 (ref 9–23)
BUN: 12 mg/dL (ref 6–24)
Bilirubin Total: 0.3 mg/dL (ref 0.0–1.2)
CO2: 24 mmol/L (ref 20–29)
Calcium: 9.2 mg/dL (ref 8.7–10.2)
Chloride: 104 mmol/L (ref 96–106)
Creatinine, Ser: 0.73 mg/dL (ref 0.57–1.00)
Globulin, Total: 2.4 g/dL (ref 1.5–4.5)
Glucose: 89 mg/dL (ref 70–99)
Potassium: 4.1 mmol/L (ref 3.5–5.2)
Sodium: 140 mmol/L (ref 134–144)
Total Protein: 7.4 g/dL (ref 6.0–8.5)
eGFR: 106 mL/min/{1.73_m2} (ref >59)

## 2023-11-18 LAB — HEPATITIS C ANTIBODY: Hep C Virus Ab: NONREACTIVE

## 2023-11-18 LAB — HIV ANTIBODY (ROUTINE TESTING W REFLEX): HIV Screen 4th Generation wRfx: NONREACTIVE

## 2023-11-18 LAB — TSH: TSH: 1.52 u[IU]/mL (ref 0.450–4.500)

## 2023-11-25 ENCOUNTER — Encounter: Payer: Self-pay | Admitting: Nurse Practitioner

## 2023-12-01 ENCOUNTER — Ambulatory Visit
Admission: RE | Admit: 2023-12-01 | Discharge: 2023-12-01 | Disposition: A | Discharge status: Home / Self Care | Payer: 59 | Source: Ambulatory Visit | Attending: Nurse Practitioner | Admitting: Nurse Practitioner

## 2023-12-01 DIAGNOSIS — Z1231 Encounter for screening mammogram for malignant neoplasm of breast: Secondary | ICD-10-CM | POA: Insufficient documentation

## 2023-12-01 MED ORDER — GADOBUTROL 1 MMOL/ML IV SOLN
10.0000 mL | Freq: Once | INTRAVENOUS | Status: AC | PRN
  Administered 2023-12-01: 10 mL via INTRAVENOUS

## 2024-04-21 ENCOUNTER — Encounter: Payer: Self-pay | Admitting: Nurse Practitioner

## 2024-05-04 ENCOUNTER — Ambulatory Visit
Admission: RE | Admit: 2024-05-04 | Discharge: 2024-05-04 | Disposition: A | Discharge status: Home / Self Care | Source: Ambulatory Visit | Attending: Nurse Practitioner | Admitting: Nurse Practitioner

## 2024-05-04 DIAGNOSIS — Z1231 Encounter for screening mammogram for malignant neoplasm of breast: Secondary | ICD-10-CM | POA: Insufficient documentation

## 2024-05-21 ENCOUNTER — Ambulatory Visit: Payer: Self-pay | Admitting: Nurse Practitioner

## 2024-06-02 ENCOUNTER — Encounter: Payer: Self-pay | Admitting: Nurse Practitioner

## 2024-06-02 ENCOUNTER — Ambulatory Visit: Admitting: Nurse Practitioner

## 2024-06-02 VITALS — BP 116/78 | HR 77 | Temp 98.0°F | Ht 66.0 in | Wt 217.4 lb

## 2024-06-02 DIAGNOSIS — Z6835 Body mass index (BMI) 35.0-35.9, adult: Secondary | ICD-10-CM | POA: Diagnosis not present

## 2024-06-02 DIAGNOSIS — F32A Depression, unspecified: Secondary | ICD-10-CM

## 2024-06-02 DIAGNOSIS — F419 Anxiety disorder, unspecified: Secondary | ICD-10-CM | POA: Diagnosis not present

## 2024-06-02 MED ORDER — TIRZEPATIDE-WEIGHT MANAGEMENT 5 MG/0.5ML ~~LOC~~ SOLN: 5.0000 mg | SUBCUTANEOUS | 2 refills | Status: DC

## 2024-06-02 MED ORDER — FLUOXETINE HCL 20 MG PO CAPS: ORAL_CAPSULE | ORAL | 1 refills | Status: DC

## 2024-06-02 NOTE — Progress Notes (Signed)
 BP 116/78   Pulse 77   Temp 98 F (36.7 C) (Oral)   Ht 5' 6 (1.676 m)   Wt 217 lb 6.4 oz (98.6 kg)   LMP 08/07/2021   SpO2 99%   BMI 35.09 kg/m    Subjective:    Patient ID: Katie Cross, female    DOB: [birth year], 43 y.o.   MRN: [id number]  HPI: Katie Cross is a 43 y.o. female  Chief Complaint  Patient presents with   Anxiety   Depression   MOOD Patient states she is doing well with the Fluoxetine .  She feels like this is a good dose for her.  Denies concerns at visit today.    WEIGHT GAIN She has lost 32lbs.  She was taking semigalutide over the last year which has led to 32lbs in weight loss. Would like to do it through our office.  Duration: years Previous attempts at weight loss: diet and exercise  Complications of obesity:  Peak weight: 249 lb Weight loss goal:  Weight loss to date:  Requesting obesity pharmacotherapy: no Current weight loss supplements/medications: no Previous weight loss supplements/meds: no Calories:      Relevant past medical, surgical, family and social history reviewed and updated as indicated. Interim medical history since our last visit reviewed. Allergies and medications reviewed and updated.  Review of Systems  Constitutional:  Positive for unexpected weight change.  Psychiatric/Behavioral:  Positive for dysphoric mood. Negative for suicidal ideas. The patient is nervous/anxious.     Per HPI unless specifically indicated above     Objective:    BP 116/78   Pulse 77   Temp 98 F (36.7 C) (Oral)   Ht 5' 6 (1.676 m)   Wt 217 lb 6.4 oz (98.6 kg)   LMP 08/07/2021   SpO2 99%   BMI 35.09 kg/m   Wt Readings from Last 3 Encounters:  06/02/24 217 lb 6.4 oz (98.6 kg)  11/17/23 229 lb 6.4 oz (104.1 kg)  08/01/22 229 lb 8 oz (104.1 kg)    Physical Exam Vitals and nursing note reviewed.  Constitutional:      General: She is not in acute distress.    Appearance: Normal appearance. She is obese. She is not  ill-appearing, toxic-appearing or diaphoretic.  HENT:     Head: Normocephalic.     Right Ear: External ear normal.     Left Ear: External ear normal.     Nose: Nose normal.     Mouth/Throat:     Mouth: Mucous membranes are moist.     Pharynx: Oropharynx is clear.  Eyes:     General:        Right eye: No discharge.        Left eye: No discharge.     Extraocular Movements: Extraocular movements intact.     Conjunctiva/sclera: Conjunctivae normal.     Pupils: Pupils are equal, round, and reactive to light.  Cardiovascular:     Rate and Rhythm: Normal rate and regular rhythm.     Heart sounds: No murmur heard. Pulmonary:     Effort: Pulmonary effort is normal. No respiratory distress.     Breath sounds: Normal breath sounds. No wheezing or rales.  Musculoskeletal:     Cervical back: Normal range of motion and neck supple.  Skin:    General: Skin is warm and dry.     Capillary Refill: Capillary refill takes less than 2 seconds.  Neurological:     General: No  focal deficit present.     Mental Status: She is alert and oriented to person, place, and time. Mental status is at baseline.  Psychiatric:        Mood and Affect: Mood normal.        Behavior: Behavior normal.        Thought Content: Thought content normal.        Judgment: Judgment normal.     Results for orders placed or performed in visit on 11/17/23  Microscopic Examination   Collection Time: 11/17/23 10:08 AM   Urine  Result Value Ref Range   WBC, UA None seen 0 - 5 /hpf   RBC, Urine 0-2 0 - 2 /hpf   Epithelial Cells (non renal) 0-10 0 - 10 /hpf   Mucus, UA Present (A) Not Estab.   Bacteria, UA None seen None seen/Few  Urinalysis, Routine w reflex microscopic   Collection Time: 11/17/23 10:08 AM  Result Value Ref Range   Specific Gravity, UA 1.025 1.005 - 1.030   pH, UA 7.0 5.0 - 7.5   Color, UA Yellow Yellow   Appearance Ur Clear Clear   Leukocytes,UA Negative Negative   Protein,UA Negative Negative/Trace    Glucose, UA Negative Negative   Ketones, UA Negative Negative   RBC, UA Trace (A) Negative   Bilirubin, UA Negative Negative   Urobilinogen, Ur 0.2 0.2 - 1.0 mg/dL   Nitrite, UA Negative Negative   Microscopic Examination See below:   CBC with Differential/Platelet   Collection Time: 11/17/23 10:10 AM  Result Value Ref Range   WBC 10.4 3.4 - 10.8 x10E3/uL   RBC 4.59 3.77 - 5.28 x10E6/uL   Hemoglobin 14.2 11.1 - 15.9 g/dL   Hematocrit 56.7 65.9 - 46.6 %   MCV 94 79 - 97 fL   MCH 30.9 26.6 - 33.0 pg   MCHC 32.9 31.5 - 35.7 g/dL   RDW 87.6 88.2 - 84.5 %   Platelets 367 150 - 450 x10E3/uL   Neutrophils 70 Not Estab. %   Lymphs 21 Not Estab. %   Monocytes 5 Not Estab. %   Eos 3 Not Estab. %   Basos 1 Not Estab. %   Neutrophils Absolute 7.2 (H) 1.4 - 7.0 x10E3/uL   Lymphocytes Absolute 2.2 0.7 - 3.1 x10E3/uL   Monocytes Absolute 0.6 0.1 - 0.9 x10E3/uL   EOS (ABSOLUTE) 0.3 0.0 - 0.4 x10E3/uL   Basophils Absolute 0.1 0.0 - 0.2 x10E3/uL   Immature Granulocytes 0 Not Estab. %   Immature Grans (Abs) 0.0 0.0 - 0.1 x10E3/uL  Comprehensive metabolic panel   Collection Time: 11/17/23 10:10 AM  Result Value Ref Range   Glucose 89 70 - 99 mg/dL   BUN 12 6 - 24 mg/dL   Creatinine, Ser 9.26 0.57 - 1.00 mg/dL   eGFR 893 >40 fO/fpw/8.26   BUN/Creatinine Ratio 16 9 - 23   Sodium 140 134 - 144 mmol/L   Potassium 4.1 3.5 - 5.2 mmol/L   Chloride 104 96 - 106 mmol/L   CO2 24 20 - 29 mmol/L   Calcium 9.2 8.7 - 10.2 mg/dL   Total Protein 7.4 6.0 - 8.5 g/dL   Albumin 5.0 (H) 3.9 - 4.9 g/dL   Globulin, Total 2.4 1.5 - 4.5 g/dL   Bilirubin Total 0.3 0.0 - 1.2 mg/dL   Alkaline Phosphatase 112 44 - 121 IU/L   AST 18 0 - 40 IU/L   ALT 26 0 - 32 IU/L  Lipid panel  Collection Time: 11/17/23 10:10 AM  Result Value Ref Range   Cholesterol, Total 208 (H) 100 - 199 mg/dL   Triglycerides 835 (H) 0 - 149 mg/dL   HDL 39 (L) >60 mg/dL   VLDL Cholesterol Cal 30 5 - 40 mg/dL   LDL Chol Calc (NIH) 860  (H) 0 - 99 mg/dL   Chol/HDL Ratio 5.3 (H) 0.0 - 4.4 ratio  TSH   Collection Time: 11/17/23 10:10 AM  Result Value Ref Range   TSH 1.520 0.450 - 4.500 uIU/mL  HIV Antibody (routine testing w rflx)   Collection Time: 11/17/23 10:10 AM  Result Value Ref Range   HIV Screen 4th Generation wRfx Non Reactive Non Reactive  Hepatitis C Antibody   Collection Time: 11/17/23 10:10 AM  Result Value Ref Range   Hep C Virus Ab Non Reactive Non Reactive      Assessment & Plan:   Problem List Items Addressed This Visit       Other   Anxiety and depression   Chronic.  Controlled.  Continue with current medication regimen of Fluoxetine  daily.  Refills sent today.  Return to clinic in 6 months for reevaluation.  Call sooner if concerns arise.        Relevant Medications   FLUoxetine  (PROZAC ) 20 MG capsule   BMI 35.0-35.9,adult - Primary   Chronic.  Not well controlled.  Patient has tried diet and exercise for weight loss without success.  Will start Zepbound  5mg  weekly.  Patient has already been on 0.5mg  of compounded Semaglutide.   Discussed how to inject medication.  Discussed side effects and benefits of medication.  Follow up in 3 months.  Call sooner if concerns arise.          Follow up plan: Return in about 3 months (around 09/02/2024) for Weight Managment.

## 2024-06-02 NOTE — Assessment & Plan Note (Signed)
Chronic.  Controlled.  Continue with current medication regimen of Fluoxetine daily.  Refills sent today.  Return to clinic in 6 months for reevaluation.  Call sooner if concerns arise.

## 2024-06-02 NOTE — Assessment & Plan Note (Signed)
 Chronic.  Not well controlled.  Patient has tried diet and exercise for weight loss without success.  Will start Zepbound  5mg  weekly.  Patient has already been on 0.5mg  of compounded Semaglutide.   Discussed how to inject medication.  Discussed side effects and benefits of medication.  Follow up in 3 months.  Call sooner if concerns arise.

## 2024-07-19 ENCOUNTER — Encounter: Payer: Self-pay | Admitting: Nurse Practitioner

## 2024-07-21 ENCOUNTER — Ambulatory Visit: Admitting: Nurse Practitioner

## 2024-07-21 ENCOUNTER — Encounter: Payer: Self-pay | Admitting: Nurse Practitioner

## 2024-07-21 VITALS — BP 120/85 | HR 72 | Temp 98.1°F | Resp 14 | Ht 65.98 in | Wt 215.6 lb

## 2024-07-21 DIAGNOSIS — G8929 Other chronic pain: Secondary | ICD-10-CM | POA: Diagnosis not present

## 2024-07-21 DIAGNOSIS — M62838 Other muscle spasm: Secondary | ICD-10-CM | POA: Diagnosis not present

## 2024-07-21 DIAGNOSIS — M545 Low back pain, unspecified: Secondary | ICD-10-CM | POA: Diagnosis not present

## 2024-07-21 MED ORDER — CYCLOBENZAPRINE HCL 5 MG PO TABS: 5.0000 mg | ORAL_TABLET | Freq: Three times a day (TID) | ORAL | 1 refills | Status: AC | PRN

## 2024-07-21 NOTE — Telephone Encounter (Signed)
 Scheduled for 9:20 am this morning and patient is aware.

## 2024-07-21 NOTE — Progress Notes (Signed)
 BP 120/85 (BP Location: Right Arm, Patient Position: Sitting, Cuff Size: Large)   Pulse 72   Temp 98.1 F (36.7 C) (Oral)   Resp 14   Ht 5' 5.98 (1.676 m)   Wt 215 lb 9.6 oz (97.8 kg)   LMP 08/07/2021   SpO2 96%   BMI 34.82 kg/m    Subjective:    Patient ID: Katie Cross, female    DOB: [birth year], 43 y.o.   MRN: [id number]  HPI: Katie Cross is a 43 y.o. female  Chief Complaint  Patient presents with   Back Pain    Ongoing for years. Started as low back but month its been the whole back.    BACK PAIN Patient states her back pain has been going on for 16-17 years.  Over the last month her back pain has moved into whole back pain.   Duration: years Mechanism of injury: unknown Location: low back Onset: gradual Severity: 5/10 Quality: sharp and dull (always dull but then sometimes sharp if she turns or twists a certain way) Frequency: constant Radiation: none Aggravating factors: twisting and reaching, lifting, walking, laying, and prolonged sitting Alleviating factors: NSAIDs Status: worse Treatments attempted: tylenol , heat, and ibuprofen   Relief with NSAIDs?: mild Nighttime pain:  yes Paresthesias / decreased sensation:  no Bowel / bladder incontinence:  no Fevers:  no Dysuria / urinary frequency:  no  Relevant past medical, surgical, family and social history reviewed and updated as indicated. Interim medical history since our last visit reviewed. Allergies and medications reviewed and updated.  Review of Systems  Musculoskeletal:  Positive for back pain.    Per HPI unless specifically indicated above     Objective:    BP 120/85 (BP Location: Right Arm, Patient Position: Sitting, Cuff Size: Large)   Pulse 72   Temp 98.1 F (36.7 C) (Oral)   Resp 14   Ht 5' 5.98 (1.676 m)   Wt 215 lb 9.6 oz (97.8 kg)   LMP 08/07/2021   SpO2 96%   BMI 34.82 kg/m   Wt Readings from Last 3 Encounters:  07/21/24 215 lb 9.6 oz (97.8 kg)  06/02/24 217 lb 6.4  oz (98.6 kg)  11/17/23 229 lb 6.4 oz (104.1 kg)    Physical Exam Vitals and nursing note reviewed.  Constitutional:      General: She is not in acute distress.    Appearance: Normal appearance. She is normal weight. She is not ill-appearing, toxic-appearing or diaphoretic.  HENT:     Head: Normocephalic.     Right Ear: External ear normal.     Left Ear: External ear normal.     Nose: Nose normal.     Mouth/Throat:     Mouth: Mucous membranes are moist.     Pharynx: Oropharynx is clear.  Eyes:     General:        Right eye: No discharge.        Left eye: No discharge.     Extraocular Movements: Extraocular movements intact.     Conjunctiva/sclera: Conjunctivae normal.     Pupils: Pupils are equal, round, and reactive to light.  Cardiovascular:     Rate and Rhythm: Normal rate and regular rhythm.     Heart sounds: No murmur heard. Pulmonary:     Effort: Pulmonary effort is normal. No respiratory distress.     Breath sounds: Normal breath sounds. No wheezing or rales.  Musculoskeletal:     Cervical back: Normal range of  motion and neck supple.     Thoracic back: Spasms present.     Lumbar back: No swelling, edema, deformity, signs of trauma or lacerations. Decreased range of motion. Negative right straight leg raise test and negative left straight leg raise test.       Back:  Skin:    General: Skin is warm and dry.     Capillary Refill: Capillary refill takes less than 2 seconds.  Neurological:     General: No focal deficit present.     Mental Status: She is alert and oriented to person, place, and time. Mental status is at baseline.  Psychiatric:        Mood and Affect: Mood normal.        Behavior: Behavior normal.        Thought Content: Thought content normal.        Judgment: Judgment normal.     Results for orders placed or performed in visit on 11/17/23  Microscopic Examination   Collection Time: 11/17/23 10:08 AM   Urine  Result Value Ref Range   WBC, UA  None seen 0 - 5 /hpf   RBC, Urine 0-2 0 - 2 /hpf   Epithelial Cells (non renal) 0-10 0 - 10 /hpf   Mucus, UA Present (A) Not Estab.   Bacteria, UA None seen None seen/Few  Urinalysis, Routine w reflex microscopic   Collection Time: 11/17/23 10:08 AM  Result Value Ref Range   Specific Gravity, UA 1.025 1.005 - 1.030   pH, UA 7.0 5.0 - 7.5   Color, UA Yellow Yellow   Appearance Ur Clear Clear   Leukocytes,UA Negative Negative   Protein,UA Negative Negative/Trace   Glucose, UA Negative Negative   Ketones, UA Negative Negative   RBC, UA Trace (A) Negative   Bilirubin, UA Negative Negative   Urobilinogen, Ur 0.2 0.2 - 1.0 mg/dL   Nitrite, UA Negative Negative   Microscopic Examination See below:   CBC with Differential/Platelet   Collection Time: 11/17/23 10:10 AM  Result Value Ref Range   WBC 10.4 3.4 - 10.8 x10E3/uL   RBC 4.59 3.77 - 5.28 x10E6/uL   Hemoglobin 14.2 11.1 - 15.9 g/dL   Hematocrit 56.7 65.9 - 46.6 %   MCV 94 79 - 97 fL   MCH 30.9 26.6 - 33.0 pg   MCHC 32.9 31.5 - 35.7 g/dL   RDW 87.6 88.2 - 84.5 %   Platelets 367 150 - 450 x10E3/uL   Neutrophils 70 Not Estab. %   Lymphs 21 Not Estab. %   Monocytes 5 Not Estab. %   Eos 3 Not Estab. %   Basos 1 Not Estab. %   Neutrophils Absolute 7.2 (H) 1.4 - 7.0 x10E3/uL   Lymphocytes Absolute 2.2 0.7 - 3.1 x10E3/uL   Monocytes Absolute 0.6 0.1 - 0.9 x10E3/uL   EOS (ABSOLUTE) 0.3 0.0 - 0.4 x10E3/uL   Basophils Absolute 0.1 0.0 - 0.2 x10E3/uL   Immature Granulocytes 0 Not Estab. %   Immature Grans (Abs) 0.0 0.0 - 0.1 x10E3/uL  Comprehensive metabolic panel   Collection Time: 11/17/23 10:10 AM  Result Value Ref Range   Glucose 89 70 - 99 mg/dL   BUN 12 6 - 24 mg/dL   Creatinine, Ser 9.26 0.57 - 1.00 mg/dL   eGFR 893 >40 fO/fpw/8.26   BUN/Creatinine Ratio 16 9 - 23   Sodium 140 134 - 144 mmol/L   Potassium 4.1 3.5 - 5.2 mmol/L   Chloride 104 96 -  106 mmol/L   CO2 24 20 - 29 mmol/L   Calcium 9.2 8.7 - 10.2 mg/dL    Total Protein 7.4 6.0 - 8.5 g/dL   Albumin 5.0 (H) 3.9 - 4.9 g/dL   Globulin, Total 2.4 1.5 - 4.5 g/dL   Bilirubin Total 0.3 0.0 - 1.2 mg/dL   Alkaline Phosphatase 112 44 - 121 IU/L   AST 18 0 - 40 IU/L   ALT 26 0 - 32 IU/L  Lipid panel   Collection Time: 11/17/23 10:10 AM  Result Value Ref Range   Cholesterol, Total 208 (H) 100 - 199 mg/dL   Triglycerides 835 (H) 0 - 149 mg/dL   HDL 39 (L) >60 mg/dL   VLDL Cholesterol Cal 30 5 - 40 mg/dL   LDL Chol Calc (NIH) 860 (H) 0 - 99 mg/dL   Chol/HDL Ratio 5.3 (H) 0.0 - 4.4 ratio  TSH   Collection Time: 11/17/23 10:10 AM  Result Value Ref Range   TSH 1.520 0.450 - 4.500 uIU/mL  HIV Antibody (routine testing w rflx)   Collection Time: 11/17/23 10:10 AM  Result Value Ref Range   HIV Screen 4th Generation wRfx Non Reactive Non Reactive  Hepatitis C Antibody   Collection Time: 11/17/23 10:10 AM  Result Value Ref Range   Hep C Virus Ab Non Reactive Non Reactive      Assessment & Plan:   Problem List Items Addressed This Visit   None Visit Diagnoses       Chronic bilateral low back pain without sciatica    -  Primary   Ongoing for many years. Will start with physical therapy.  May need MRI in the future.   Relevant Medications   cyclobenzaprine  (FLEXERIL ) 5 MG tablet   Other Relevant Orders   Ambulatory referral to Physical Therapy     Muscle spasm       Will treat with cyclobenzaprine  as needed.  Follow up if not improved.        Follow up plan: No follow-ups on file.

## 2024-08-03 ENCOUNTER — Other Ambulatory Visit: Payer: Self-pay | Admitting: Nurse Practitioner

## 2024-08-05 ENCOUNTER — Other Ambulatory Visit: Payer: Self-pay | Admitting: Nurse Practitioner

## 2024-08-05 NOTE — Telephone Encounter (Signed)
 Requested medication (s) are due for refill today: yes  Requested medication (s) are on the active medication list: yes  Last refill:  05/26/24  Future visit scheduled: {Yes  Notes to clinic:  Medication not assigned to a protocol, review manually.      Requested Prescriptions  Pending Prescriptions Disp Refills   ZEPBOUND  5 MG/0.5ML injection vial [Pharmacy Med Name: ZEPBOUND  5 MG/0.5ML SUBCUTANEOUS SOLUTION] 2 mL 2    Sig: INJECT 0.5 ML (5 MG) UNDER THE SKIN ONCE WEEKLY (0.5ML= 50 UNITS)     Off-Protocol Failed - 08/05/2024  9:10 AM      Failed - Medication not assigned to a protocol, review manually.      Passed - Valid encounter within last 12 months    Recent Outpatient Visits           2 weeks ago Chronic bilateral low back pain without sciatica   [place] [facility] Melvin Pao, NP   2 months ago BMI 35.0-35.9,adult   [facility] Melvin Pao, NP

## 2024-08-06 NOTE — Telephone Encounter (Signed)
 Requested medication (s) are due for refill today:   Yes  Requested medication (s) are on the active medication list:   Yes  Future visit scheduled:   Yes 09/08/2024    Last ordered: 08/05/2024 2 ml, 2 refills  No protocol assigned.   Looks like this may be a duplicate request   Requested Prescriptions  Pending Prescriptions Disp Refills   ZEPBOUND  5 MG/0.5ML injection vial [Pharmacy Med Name: ZEPBOUND  5 MG/0.5ML SUBCUTANEOUS SOLUTION] 2 mL 2    Sig: INJECT 0.5 ML (5 MG) UNDER THE SKIN ONCE WEEKLY (0.5ML= 50 UNITS)     Off-Protocol Failed - 08/06/2024 11:02 AM      Failed - Medication not assigned to a protocol, review manually.      Passed - Valid encounter within last 12 months    Recent Outpatient Visits           2 weeks ago Chronic bilateral low back pain without sciatica   [place] [facility] Melvin Pao, NP   2 months ago BMI 35.0-35.9,adult   [place] [facility] Melvin Pao, NP

## 2024-09-02 ENCOUNTER — Ambulatory Visit: Admitting: Nurse Practitioner

## 2024-09-08 ENCOUNTER — Ambulatory Visit: Admitting: Nurse Practitioner

## 2024-09-08 ENCOUNTER — Encounter: Payer: Self-pay | Admitting: Nurse Practitioner

## 2024-09-08 VITALS — BP 132/79 | HR 80 | Temp 98.2°F | Ht 66.0 in | Wt 211.2 lb

## 2024-09-08 DIAGNOSIS — F419 Anxiety disorder, unspecified: Secondary | ICD-10-CM | POA: Diagnosis not present

## 2024-09-08 DIAGNOSIS — Z6835 Body mass index (BMI) 35.0-35.9, adult: Secondary | ICD-10-CM | POA: Diagnosis not present

## 2024-09-08 DIAGNOSIS — F32A Depression, unspecified: Secondary | ICD-10-CM | POA: Diagnosis not present

## 2024-09-08 MED ORDER — FLUOXETINE HCL 40 MG PO CAPS: 40.0000 mg | ORAL_CAPSULE | Freq: Every day | ORAL | 1 refills | Status: DC

## 2024-09-08 MED ORDER — TIRZEPATIDE-WEIGHT MANAGEMENT 7.5 MG/0.5ML ~~LOC~~ SOLN: 7.5000 mg | SUBCUTANEOUS | 0 refills | Status: DC

## 2024-09-08 NOTE — Assessment & Plan Note (Signed)
 Chronic.  Ongoing concern.  Weight loss has been slow.  Will increase Zepboung to 7.5mg  weekly.  If not improved at increased dose, will increase to 10mg  in one month.  Patient will reach out via mychart if dose needs to be increased. Follow up in 3 months.  Call sooner if concerns arise.

## 2024-09-08 NOTE — Assessment & Plan Note (Addendum)
 Chronic. Not well controlled.  Will increase dose of Fluoxetine  to 40mg  daily.  Follow up in 3 months.  Call sooner if concerns arise.

## 2024-09-08 NOTE — Progress Notes (Signed)
 BP 132/79   Pulse 80   Temp 98.2 F (36.8 C) (Oral)   Ht 5' 6 (1.676 m)   Wt 211 lb 3.2 oz (95.8 kg)   LMP 08/07/2021   SpO2 97%   BMI 34.09 kg/m    Subjective:    Patient ID: Katie Cross, female    DOB: 1981-02-14, 43 y.o.   MRN: [id number]  HPI: Katie Cross is a 43 y.o. female  Chief Complaint  Patient presents with   Weight Management Screening   Anxiety   MOOD Patient states she is doing well with the Fluoxetine .  She feels like she has been more on edge lately.  She does have more on her plate lately.  She is open to increasing the dose.   Denies concerns at visit today.    WEIGHT GAIN She has lost 36lbs.  She is currently taking Zepbound  5mg  weekly.  Has lost about 4lbs since last visit.  Duration: years Previous attempts at weight loss: diet and exercise  Complications of obesity:  Peak weight: 249 lb Weight loss goal:  Weight loss to date:  Requesting obesity pharmacotherapy: no Current weight loss supplements/medications: no Previous weight loss supplements/meds: no Calories:      Relevant past medical, surgical, family and social history reviewed and updated as indicated. Interim medical history since our last visit reviewed. Allergies and medications reviewed and updated.  Review of Systems  Constitutional:  Positive for unexpected weight change.  Psychiatric/Behavioral:  Positive for dysphoric mood. Negative for suicidal ideas. The patient is nervous/anxious.     Per HPI unless specifically indicated above     Objective:    BP 132/79   Pulse 80   Temp 98.2 F (36.8 C) (Oral)   Ht 5' 6 (1.676 m)   Wt 211 lb 3.2 oz (95.8 kg)   LMP 08/07/2021   SpO2 97%   BMI 34.09 kg/m   Wt Readings from Last 3 Encounters:  09/08/24 211 lb 3.2 oz (95.8 kg)  07/21/24 215 lb 9.6 oz (97.8 kg)  06/02/24 217 lb 6.4 oz (98.6 kg)    Physical Exam Vitals and nursing note reviewed.  Constitutional:      General: She is not in acute distress.     Appearance: Normal appearance. She is obese. She is not ill-appearing, toxic-appearing or diaphoretic.  HENT:     Head: Normocephalic.     Right Ear: External ear normal.     Left Ear: External ear normal.     Nose: Nose normal.     Mouth/Throat:     Mouth: Mucous membranes are moist.     Pharynx: Oropharynx is clear.  Eyes:     General:        Right eye: No discharge.        Left eye: No discharge.     Extraocular Movements: Extraocular movements intact.     Conjunctiva/sclera: Conjunctivae normal.     Pupils: Pupils are equal, round, and reactive to light.  Cardiovascular:     Rate and Rhythm: Normal rate and regular rhythm.     Heart sounds: No murmur heard. Pulmonary:     Effort: Pulmonary effort is normal. No respiratory distress.     Breath sounds: Normal breath sounds. No wheezing or rales.  Musculoskeletal:     Cervical back: Normal range of motion and neck supple.  Skin:    General: Skin is warm and dry.     Capillary Refill: Capillary refill takes less  than 2 seconds.  Neurological:     General: No focal deficit present.     Mental Status: She is alert and oriented to person, place, and time. Mental status is at baseline.  Psychiatric:        Mood and Affect: Mood normal.        Behavior: Behavior normal.        Thought Content: Thought content normal.        Judgment: Judgment normal.     Results for orders placed or performed in visit on 11/17/23  Microscopic Examination   Collection Time: 11/17/23 10:08 AM   Urine  Result Value Ref Range   WBC, UA None seen 0 - 5 /hpf   RBC, Urine 0-2 0 - 2 /hpf   Epithelial Cells (non renal) 0-10 0 - 10 /hpf   Mucus, UA Present (A) Not Estab.   Bacteria, UA None seen None seen/Few  Urinalysis, Routine w reflex microscopic   Collection Time: 11/17/23 10:08 AM  Result Value Ref Range   Specific Gravity, UA 1.025 1.005 - 1.030   pH, UA 7.0 5.0 - 7.5   Color, UA Yellow Yellow   Appearance Ur Clear Clear   Leukocytes,UA  Negative Negative   Protein,UA Negative Negative/Trace   Glucose, UA Negative Negative   Ketones, UA Negative Negative   RBC, UA Trace (A) Negative   Bilirubin, UA Negative Negative   Urobilinogen, Ur 0.2 0.2 - 1.0 mg/dL   Nitrite, UA Negative Negative   Microscopic Examination See below:   CBC with Differential/Platelet   Collection Time: 11/17/23 10:10 AM  Result Value Ref Range   WBC 10.4 3.4 - 10.8 x10E3/uL   RBC 4.59 3.77 - 5.28 x10E6/uL   Hemoglobin 14.2 11.1 - 15.9 g/dL   Hematocrit 56.7 65.9 - 46.6 %   MCV 94 79 - 97 fL   MCH 30.9 26.6 - 33.0 pg   MCHC 32.9 31.5 - 35.7 g/dL   RDW 87.6 88.2 - 84.5 %   Platelets 367 150 - 450 x10E3/uL   Neutrophils 70 Not Estab. %   Lymphs 21 Not Estab. %   Monocytes 5 Not Estab. %   Eos 3 Not Estab. %   Basos 1 Not Estab. %   Neutrophils Absolute 7.2 (H) 1.4 - 7.0 x10E3/uL   Lymphocytes Absolute 2.2 0.7 - 3.1 x10E3/uL   Monocytes Absolute 0.6 0.1 - 0.9 x10E3/uL   EOS (ABSOLUTE) 0.3 0.0 - 0.4 x10E3/uL   Basophils Absolute 0.1 0.0 - 0.2 x10E3/uL   Immature Granulocytes 0 Not Estab. %   Immature Grans (Abs) 0.0 0.0 - 0.1 x10E3/uL  Comprehensive metabolic panel   Collection Time: 11/17/23 10:10 AM  Result Value Ref Range   Glucose 89 70 - 99 mg/dL   BUN 12 6 - 24 mg/dL   Creatinine, Ser 9.26 0.57 - 1.00 mg/dL   eGFR 893 >40 fO/fpw/8.26   BUN/Creatinine Ratio 16 9 - 23   Sodium 140 134 - 144 mmol/L   Potassium 4.1 3.5 - 5.2 mmol/L   Chloride 104 96 - 106 mmol/L   CO2 24 20 - 29 mmol/L   Calcium 9.2 8.7 - 10.2 mg/dL   Total Protein 7.4 6.0 - 8.5 g/dL   Albumin 5.0 (H) 3.9 - 4.9 g/dL   Globulin, Total 2.4 1.5 - 4.5 g/dL   Bilirubin Total 0.3 0.0 - 1.2 mg/dL   Alkaline Phosphatase 112 44 - 121 IU/L   AST 18 0 - 40 IU/L  ALT 26 0 - 32 IU/L  Lipid panel   Collection Time: 11/17/23 10:10 AM  Result Value Ref Range   Cholesterol, Total 208 (H) 100 - 199 mg/dL   Triglycerides 835 (H) 0 - 149 mg/dL   HDL 39 (L) >60 mg/dL   VLDL  Cholesterol Cal 30 5 - 40 mg/dL   LDL Chol Calc (NIH) 860 (H) 0 - 99 mg/dL   Chol/HDL Ratio 5.3 (H) 0.0 - 4.4 ratio  TSH   Collection Time: 11/17/23 10:10 AM  Result Value Ref Range   TSH 1.520 0.450 - 4.500 uIU/mL  HIV Antibody (routine testing w rflx)   Collection Time: 11/17/23 10:10 AM  Result Value Ref Range   HIV Screen 4th Generation wRfx Non Reactive Non Reactive  Hepatitis C Antibody   Collection Time: 11/17/23 10:10 AM  Result Value Ref Range   Hep C Virus Ab Non Reactive Non Reactive      Assessment & Plan:   Problem List Items Addressed This Visit       Other   Anxiety and depression   Chronic. Not well controlled.  Will increase dose of Fluoxetine  to 40mg  daily.  Follow up in 3 months.  Call sooner if concerns arise.       Relevant Medications   FLUoxetine  (PROZAC ) 40 MG capsule   BMI 35.0-35.9,adult - Primary   Chronic.  Ongoing concern.  Weight loss has been slow.  Will increase Zepboung to 7.5mg  weekly.  If not improved at increased dose, will increase to 10mg  in one month.  Patient will reach out via mychart if dose needs to be increased. Follow up in 3 months.  Call sooner if concerns arise.        Follow up plan: Return in about 3 months (around 12/09/2024) for Weight Managment.

## 2024-10-11 ENCOUNTER — Other Ambulatory Visit: Payer: Self-pay | Admitting: Nurse Practitioner

## 2024-10-13 ENCOUNTER — Encounter: Payer: Self-pay | Admitting: Nurse Practitioner

## 2024-10-14 NOTE — Telephone Encounter (Signed)
 Requested medication (s) are due for refill today: yes  Requested medication (s) are on the active medication list: yes  Last refill:  09/08/24  Future visit scheduled: yes  Notes to clinic:  Medication not assigned to a protocol, review manually.      Requested Prescriptions  Pending Prescriptions Disp Refills   ZEPBOUND  7.5 MG/0.5ML injection vial [Pharmacy Med Name: ZEPBOUND  7.5 MG/0.5ML SUBCUTANEOUS SOLUTION] 2 mL 0    Sig: INJECT 0.5 ML (7.5 MG) UNDER THE SKIN ONCE WEEKLY (0.5ML= 50 UNITS)     Off-Protocol Failed - 10/14/2024 11:43 AM      Failed - Medication not assigned to a protocol, review manually.      Passed - Valid encounter within last 12 months    Recent Outpatient Visits           1 month ago BMI 35.0-35.9,adult   [place] [facility] [place], Darice, NP   2 months ago Chronic bilateral low back pain without sciatica   [place] [facility] Melvin Darice, NP   4 months ago BMI 35.0-35.9,adult   [place] [facility] Melvin Darice, NP

## 2024-11-09 ENCOUNTER — Other Ambulatory Visit: Payer: Self-pay | Admitting: Nurse Practitioner

## 2024-11-09 MED ORDER — ZEPBOUND 7.5 MG/0.5ML ~~LOC~~ SOLN: 7.5000 mg | SUBCUTANEOUS | 2 refills | Status: DC

## 2024-11-11 NOTE — Telephone Encounter (Signed)
 Already refilled on 11/09/24 in a separate refill encounter.Will refuse this request due to this.  Requested Prescriptions  Pending Prescriptions Disp Refills   ZEPBOUND  7.5 MG/0.5ML injection vial [Pharmacy Med Name: ZEPBOUND  7.5 MG/0.5ML SUBCUTANEOUS SOLUTION] 2 mL 2    Sig: INJECT 0.5 ML (7.5 MG) UNDER THE SKIN ONCE WEEKLY (0.5ML= 50 UNITS)     Off-Protocol Failed - 11/11/2024  5:06 PM      Failed - Medication not assigned to a protocol, review manually.      Passed - Valid encounter within last 12 months    Recent Outpatient Visits           2 months ago BMI 35.0-35.9,adult   [place] [facility] [place], Darice, NP   3 months ago Chronic bilateral low back pain without sciatica   [place] [facility] Melvin Darice, NP   5 months ago BMI 35.0-35.9,adult    [facility] Melvin Darice, NP

## 2024-12-14 ENCOUNTER — Encounter: Payer: Self-pay | Admitting: Nurse Practitioner

## 2024-12-14 ENCOUNTER — Ambulatory Visit: Admitting: Nurse Practitioner

## 2024-12-14 VITALS — BP 121/87 | HR 76 | Temp 97.8°F | Ht 65.98 in | Wt 204.8 lb

## 2024-12-14 DIAGNOSIS — F32A Depression, unspecified: Secondary | ICD-10-CM

## 2024-12-14 DIAGNOSIS — Z6835 Body mass index (BMI) 35.0-35.9, adult: Secondary | ICD-10-CM | POA: Diagnosis not present

## 2024-12-14 DIAGNOSIS — F419 Anxiety disorder, unspecified: Secondary | ICD-10-CM

## 2024-12-14 MED ORDER — TIRZEPATIDE-WEIGHT MANAGEMENT 10 MG/0.5ML ~~LOC~~ SOLN: 10.0000 mg | SUBCUTANEOUS | 2 refills | Status: AC

## 2024-12-14 MED ORDER — FLUOXETINE HCL 20 MG PO CAPS: 20.0000 mg | ORAL_CAPSULE | Freq: Every day | ORAL | 1 refills | Status: AC

## 2024-12-14 NOTE — Assessment & Plan Note (Signed)
 Chronic.  Controlled.  However, patient has been having a hard time with fatigue with the increased dose. Will decrease dose of Fluoxetine  back to 20mg  daily. Follow up in 3 months.  Call sooner if concerns arise.

## 2024-12-14 NOTE — Progress Notes (Signed)
 "  BP 121/87 (BP Location: Right Arm, Patient Position: Sitting, Cuff Size: Normal)   Pulse 76   Temp 97.8 F (36.6 C) (Oral)   Ht 5' 5.98 (1.676 m)   Wt 204 lb 12.8 oz (92.9 kg)   LMP 08/07/2021   SpO2 99%   BMI 33.07 kg/m    Subjective:    Patient ID: Katie Cross, female    DOB: [birth year], 44 y.o.   MRN: [id number]  HPI: Katie Cross is a 44 y.o. female  Chief Complaint  Patient presents with   Weight Management Screening    3 month F/u   MOOD Patient states she is feeling more lazy and sleepy with the fluoxetine  40mg .  She would like to decrease the dose back o 20mg  to see if her symptoms improve. Denies concerns at visit today.    WEIGHT GAIN She has lost 42lbs.  She is currently taking Zepbound  7.5mg  weekly.  Has lost about 6lbs since last visit.  Duration: years Previous attempts at weight loss: diet and exercise  Complications of obesity:  Peak weight: 249 lb Weight loss goal:  Weight loss to date:  Requesting obesity pharmacotherapy: no Current weight loss supplements/medications: no Previous weight loss supplements/meds: no Calories:      Relevant past medical, surgical, family and social history reviewed and updated as indicated. Interim medical history since our last visit reviewed. Allergies and medications reviewed and updated.  Review of Systems  Constitutional:  Positive for fatigue. Negative for unexpected weight change.  Psychiatric/Behavioral:  Positive for dysphoric mood. Negative for suicidal ideas. The patient is nervous/anxious.     Per HPI unless specifically indicated above     Objective:    BP 121/87 (BP Location: Right Arm, Patient Position: Sitting, Cuff Size: Normal)   Pulse 76   Temp 97.8 F (36.6 C) (Oral)   Ht 5' 5.98 (1.676 m)   Wt 204 lb 12.8 oz (92.9 kg)   LMP 08/07/2021   SpO2 99%   BMI 33.07 kg/m   Wt Readings from Last 3 Encounters:  12/14/24 204 lb 12.8 oz (92.9 kg)  09/08/24 211 lb 3.2 oz (95.8 kg)   07/21/24 215 lb 9.6 oz (97.8 kg)    Physical Exam Vitals and nursing note reviewed.  Constitutional:      General: She is not in acute distress.    Appearance: Normal appearance. She is obese. She is not ill-appearing, toxic-appearing or diaphoretic.  HENT:     Head: Normocephalic.     Right Ear: External ear normal.     Left Ear: External ear normal.     Nose: Nose normal.     Mouth/Throat:     Mouth: Mucous membranes are moist.     Pharynx: Oropharynx is clear.  Eyes:     General:        Right eye: No discharge.        Left eye: No discharge.     Extraocular Movements: Extraocular movements intact.     Conjunctiva/sclera: Conjunctivae normal.     Pupils: Pupils are equal, round, and reactive to light.  Cardiovascular:     Rate and Rhythm: Normal rate and regular rhythm.     Heart sounds: No murmur heard. Pulmonary:     Effort: Pulmonary effort is normal. No respiratory distress.     Breath sounds: Normal breath sounds. No wheezing or rales.  Musculoskeletal:     Cervical back: Normal range of motion and neck supple.  Skin:  General: Skin is warm and dry.     Capillary Refill: Capillary refill takes less than 2 seconds.  Neurological:     General: No focal deficit present.     Mental Status: She is alert and oriented to person, place, and time. Mental status is at baseline.  Psychiatric:        Mood and Affect: Mood normal.        Behavior: Behavior normal.        Thought Content: Thought content normal.        Judgment: Judgment normal.     Results for orders placed or performed in visit on 11/17/23  Microscopic Examination   Collection Time: 11/17/23 10:08 AM   Urine  Result Value Ref Range   WBC, UA None seen 0 - 5 /hpf   RBC, Urine 0-2 0 - 2 /hpf   Epithelial Cells (non renal) 0-10 0 - 10 /hpf   Mucus, UA Present (A) Not Estab.   Bacteria, UA None seen None seen/Few  Urinalysis, Routine w reflex microscopic   Collection Time: 11/17/23 10:08 AM  Result  Value Ref Range   Specific Gravity, UA 1.025 1.005 - 1.030   pH, UA 7.0 5.0 - 7.5   Color, UA Yellow Yellow   Appearance Ur Clear Clear   Leukocytes,UA Negative Negative   Protein,UA Negative Negative/Trace   Glucose, UA Negative Negative   Ketones, UA Negative Negative   RBC, UA Trace (A) Negative   Bilirubin, UA Negative Negative   Urobilinogen, Ur 0.2 0.2 - 1.0 mg/dL   Nitrite, UA Negative Negative   Microscopic Examination See below:   CBC with Differential/Platelet   Collection Time: 11/17/23 10:10 AM  Result Value Ref Range   WBC 10.4 3.4 - 10.8 x10E3/uL   RBC 4.59 3.77 - 5.28 x10E6/uL   Hemoglobin 14.2 11.1 - 15.9 g/dL   Hematocrit 56.7 65.9 - 46.6 %   MCV 94 79 - 97 fL   MCH 30.9 26.6 - 33.0 pg   MCHC 32.9 31.5 - 35.7 g/dL   RDW 87.6 88.2 - 84.5 %   Platelets 367 150 - 450 x10E3/uL   Neutrophils 70 Not Estab. %   Lymphs 21 Not Estab. %   Monocytes 5 Not Estab. %   Eos 3 Not Estab. %   Basos 1 Not Estab. %   Neutrophils Absolute 7.2 (H) 1.4 - 7.0 x10E3/uL   Lymphocytes Absolute 2.2 0.7 - 3.1 x10E3/uL   Monocytes Absolute 0.6 0.1 - 0.9 x10E3/uL   EOS (ABSOLUTE) 0.3 0.0 - 0.4 x10E3/uL   Basophils Absolute 0.1 0.0 - 0.2 x10E3/uL   Immature Granulocytes 0 Not Estab. %   Immature Grans (Abs) 0.0 0.0 - 0.1 x10E3/uL  Comprehensive metabolic panel   Collection Time: 11/17/23 10:10 AM  Result Value Ref Range   Glucose 89 70 - 99 mg/dL   BUN 12 6 - 24 mg/dL   Creatinine, Ser 9.26 0.57 - 1.00 mg/dL   eGFR 893 >40 fO/fpw/8.26   BUN/Creatinine Ratio 16 9 - 23   Sodium 140 134 - 144 mmol/L   Potassium 4.1 3.5 - 5.2 mmol/L   Chloride 104 96 - 106 mmol/L   CO2 24 20 - 29 mmol/L   Calcium 9.2 8.7 - 10.2 mg/dL   Total Protein 7.4 6.0 - 8.5 g/dL   Albumin 5.0 (H) 3.9 - 4.9 g/dL   Globulin, Total 2.4 1.5 - 4.5 g/dL   Bilirubin Total 0.3 0.0 - 1.2 mg/dL  Alkaline Phosphatase 112 44 - 121 IU/L   AST 18 0 - 40 IU/L   ALT 26 0 - 32 IU/L  Lipid panel   Collection Time:  11/17/23 10:10 AM  Result Value Ref Range   Cholesterol, Total 208 (H) 100 - 199 mg/dL   Triglycerides 835 (H) 0 - 149 mg/dL   HDL 39 (L) >60 mg/dL   VLDL Cholesterol Cal 30 5 - 40 mg/dL   LDL Chol Calc (NIH) 860 (H) 0 - 99 mg/dL   Chol/HDL Ratio 5.3 (H) 0.0 - 4.4 ratio  TSH   Collection Time: 11/17/23 10:10 AM  Result Value Ref Range   TSH 1.520 0.450 - 4.500 uIU/mL  HIV Antibody (routine testing w rflx)   Collection Time: 11/17/23 10:10 AM  Result Value Ref Range   HIV Screen 4th Generation wRfx Non Reactive Non Reactive  Hepatitis C Antibody   Collection Time: 11/17/23 10:10 AM  Result Value Ref Range   Hep C Virus Ab Non Reactive Non Reactive      Assessment & Plan:   Problem List Items Addressed This Visit       Other   Anxiety and depression - Primary   Chronic.  Controlled.  However, patient has been having a hard time with fatigue with the increased dose. Will decrease dose of Fluoxetine  back to 20mg  daily. Follow up in 3 months.  Call sooner if concerns arise.       Relevant Medications   FLUoxetine  (PROZAC ) 20 MG capsule   BMI 35.0-35.9,adult   Chronic.  Ongoing concern.  Weight loss has been slow.  Will increase Zepboung to 10mg  weekly.  If not improved at increased dose.  Has lost about 6lbs since last visit.  Patient will reach out via mychart if dose needs to be increased. Follow up in 3 months.  Call sooner if concerns arise.         Follow up plan: Return in about 3 months (around 03/14/2025) for Weight Managment.      "

## 2024-12-14 NOTE — Assessment & Plan Note (Signed)
 Chronic.  Ongoing concern.  Weight loss has been slow.  Will increase Zepboung to 10mg  weekly.  If not improved at increased dose.  Has lost about 6lbs since last visit.  Patient will reach out via mychart if dose needs to be increased. Follow up in 3 months.  Call sooner if concerns arise.

## 2025-03-21 ENCOUNTER — Ambulatory Visit: Admitting: Nurse Practitioner
